# Patient Record
Sex: Male | Born: 1970 | Race: White | Hispanic: No | Marital: Married | Smoking: Current every day smoker
Health system: Southern US, Community
[De-identification: ages and names within clinical notes are randomized; demographics above are authoritative.]

## PROBLEM LIST (undated history)

## (undated) DIAGNOSIS — I10 Essential (primary) hypertension: Secondary | ICD-10-CM

## (undated) DIAGNOSIS — E119 Type 2 diabetes mellitus without complications: Secondary | ICD-10-CM

## (undated) HISTORY — PX: FOOT SURGERY: SHX648

---

## 2015-11-13 ENCOUNTER — Encounter (HOSPITAL_COMMUNITY): Payer: Self-pay | Admitting: Family Medicine

## 2015-11-13 ENCOUNTER — Inpatient Hospital Stay (HOSPITAL_COMMUNITY)
Admission: AD | Admit: 2015-11-13 | Discharge: 2015-11-16 | DRG: 064 | Disposition: A | Discharge status: Rehabilitation Facility | Payer: Self-pay | Source: Other Acute Inpatient Hospital | Attending: Neurology | Admitting: Neurology

## 2015-11-13 ENCOUNTER — Inpatient Hospital Stay (HOSPITAL_COMMUNITY): Payer: Self-pay

## 2015-11-13 DIAGNOSIS — E0811 Diabetes mellitus due to underlying condition with ketoacidosis with coma: Secondary | ICD-10-CM

## 2015-11-13 DIAGNOSIS — E785 Hyperlipidemia, unspecified: Secondary | ICD-10-CM | POA: Diagnosis present

## 2015-11-13 DIAGNOSIS — I161 Hypertensive emergency: Secondary | ICD-10-CM | POA: Diagnosis present

## 2015-11-13 DIAGNOSIS — I63219 Cerebral infarction due to unspecified occlusion or stenosis of unspecified vertebral arteries: Principal | ICD-10-CM | POA: Diagnosis present

## 2015-11-13 DIAGNOSIS — E1165 Type 2 diabetes mellitus with hyperglycemia: Secondary | ICD-10-CM | POA: Diagnosis present

## 2015-11-13 DIAGNOSIS — R1310 Dysphagia, unspecified: Secondary | ICD-10-CM | POA: Diagnosis present

## 2015-11-13 DIAGNOSIS — D72829 Elevated white blood cell count, unspecified: Secondary | ICD-10-CM | POA: Diagnosis present

## 2015-11-13 DIAGNOSIS — Z720 Tobacco use: Secondary | ICD-10-CM

## 2015-11-13 DIAGNOSIS — F17210 Nicotine dependence, cigarettes, uncomplicated: Secondary | ICD-10-CM | POA: Diagnosis present

## 2015-11-13 DIAGNOSIS — R270 Ataxia, unspecified: Secondary | ICD-10-CM | POA: Diagnosis present

## 2015-11-13 DIAGNOSIS — E119 Type 2 diabetes mellitus without complications: Secondary | ICD-10-CM

## 2015-11-13 DIAGNOSIS — I10 Essential (primary) hypertension: Secondary | ICD-10-CM

## 2015-11-13 DIAGNOSIS — G935 Compression of brain: Secondary | ICD-10-CM | POA: Diagnosis present

## 2015-11-13 DIAGNOSIS — G936 Cerebral edema: Secondary | ICD-10-CM | POA: Diagnosis present

## 2015-11-13 DIAGNOSIS — I639 Cerebral infarction, unspecified: Secondary | ICD-10-CM | POA: Diagnosis present

## 2015-11-13 HISTORY — DX: Type 2 diabetes mellitus without complications: E11.9

## 2015-11-13 HISTORY — DX: Essential (primary) hypertension: I10

## 2015-11-13 LAB — CBC
HEMATOCRIT: 47.2 % (ref 39.0–52.0)
HEMOGLOBIN: 16.6 g/dL (ref 13.0–17.0)
MCH: 30.6 pg (ref 26.0–34.0)
MCHC: 35.2 g/dL (ref 30.0–36.0)
MCV: 87.1 fL (ref 78.0–100.0)
Platelets: 285 10*3/uL (ref 150–400)
RBC: 5.42 MIL/uL (ref 4.22–5.81)
RDW: 12.4 % (ref 11.5–15.5)
WBC: 17.1 10*3/uL — ABNORMAL HIGH (ref 4.0–10.5)

## 2015-11-13 LAB — PROTIME-INR
INR: 1.1 (ref 0.00–1.49)
Prothrombin Time: 14.4 seconds (ref 11.6–15.2)

## 2015-11-13 LAB — APTT: aPTT: 28 seconds (ref 24–37)

## 2015-11-13 MED ORDER — SODIUM CHLORIDE 0.9 % IV SOLN
INTRAVENOUS | Status: AC
  Administered 2015-11-14: via INTRAVENOUS

## 2015-11-13 MED ORDER — ASPIRIN 300 MG RE SUPP
300.0000 mg | Freq: Every day | RECTAL | Status: DC
  Filled 2015-11-13: qty 1

## 2015-11-13 MED ORDER — ACETAMINOPHEN 325 MG PO TABS: 650.0000 mg | ORAL_TABLET | ORAL | Status: DC | PRN

## 2015-11-13 MED ORDER — ACETAMINOPHEN 650 MG RE SUPP: 650.0000 mg | RECTAL | Status: DC | PRN

## 2015-11-13 MED ORDER — HYDRALAZINE HCL 20 MG/ML IJ SOLN
10.0000 mg | Freq: Four times a day (QID) | INTRAMUSCULAR | Status: DC | PRN
  Administered 2015-11-14: 10 mg via INTRAVENOUS
  Filled 2015-11-13: qty 1

## 2015-11-13 MED ORDER — NICARDIPINE HCL IN NACL 20-0.86 MG/200ML-% IV SOLN
3.0000 mg/h | INTRAVENOUS | Status: DC
  Administered 2015-11-14: 5 mg/h via INTRAVENOUS
  Administered 2015-11-14: 8 mg/h via INTRAVENOUS
  Filled 2015-11-13 (×2): qty 200

## 2015-11-13 MED ORDER — SENNOSIDES-DOCUSATE SODIUM 8.6-50 MG PO TABS: 1.0000 | ORAL_TABLET | Freq: Every evening | ORAL | Status: DC | PRN

## 2015-11-13 MED ORDER — INSULIN ASPART 100 UNIT/ML ~~LOC~~ SOLN
0.0000 [IU] | SUBCUTANEOUS | Status: DC
  Administered 2015-11-14: 5 [IU] via SUBCUTANEOUS
  Administered 2015-11-14: 3 [IU] via SUBCUTANEOUS
  Administered 2015-11-14: 8 [IU] via SUBCUTANEOUS
  Administered 2015-11-14: 3 [IU] via SUBCUTANEOUS
  Administered 2015-11-14 (×2): 8 [IU] via SUBCUTANEOUS
  Administered 2015-11-14 – 2015-11-15 (×2): 5 [IU] via SUBCUTANEOUS
  Administered 2015-11-15: 3 [IU] via SUBCUTANEOUS
  Administered 2015-11-15: 11 [IU] via SUBCUTANEOUS
  Administered 2015-11-15 (×2): 8 [IU] via SUBCUTANEOUS
  Administered 2015-11-16 (×3): 5 [IU] via SUBCUTANEOUS
  Administered 2015-11-16: 8 [IU] via SUBCUTANEOUS
  Administered 2015-11-16: 5 [IU] via SUBCUTANEOUS

## 2015-11-13 MED ORDER — STROKE: EARLY STAGES OF RECOVERY BOOK
Freq: Once | Status: AC
  Administered 2015-11-13: 22:00:00
  Filled 2015-11-13: qty 1

## 2015-11-13 MED ORDER — ASPIRIN 325 MG PO TABS
325.0000 mg | ORAL_TABLET | Freq: Every day | ORAL | Status: DC
  Administered 2015-11-14 – 2015-11-16 (×3): 325 mg via ORAL
  Filled 2015-11-13 (×3): qty 1

## 2015-11-13 NOTE — Progress Notes (Signed)
Received call for transfer from EDP at [facility]. Patient with a history of diabetes, diet controlled and hypertension without antihypertensive medications. Patient presented to [facility] with 2 days of acute onset vertical vertigo and left hand clumsiness. Associated with nausea and general ataxia. Upon evaluation in the ED patient was found to have a large left cerebellar stroke. EDP discussed case with neurology and neurosurgery of [place] system recommended CTA to evaluate for possible dissection. Once patient has had CTA he'll be transferred to [facility] either as a stepdown admission or telemetry based on results of CTA. The patient arrives neurology and possibly neurosurgery will need to be consult. Per EDP there is no neurology service at [facility]  Shelly Flattenavid Merrell, MD [facility] 11/13/2015, 11:30 AM

## 2015-11-13 NOTE — H&P (Addendum)
[facility] History and Physical  Derrick Robinson Derrick Robinson:[id number] DOB: 05-15-1971 DOA: 11/13/2015  Referring physician: [facility] PCP: No PCP Per Patient   Chief Complaint: "He couldn't walk down the hall." - girlfriend  HPI: Derrick Robinson Derrick Robinson is a 45 y.o. male with a past medical history of diabetes and hypertension without any medication taken or medical follow-up presented originally to [facility] room with a complaint of dizziness and headache. The evening of Thanksgiving patient began to have acute onset of the world was spinning and left hand clumsiness. He also has some nausea and vomited one time nonbloody nonbilious. Patient had trouble walking. This went on for the next 2 days. When patient's symptoms is not resolved he went to [facility] room for evaluation. In the [facility] a CT was done and showed a large left cerebellar stroke. [facility] neurology was called and the ED physician was advised to give CTA before shipping the patient to [facility]. CTA was done which also showed a large left cerebellar infarct. Involving the PICA on the left. These images cannot be viewed by Derrick Robinson to fully assess any obstruction. Upon arriving to [facility] Dr. Cena Robinson with neurology was notified and patient was urgently taken to CT for repeat scan. Repeat scan shows obstruction and Dr. Cena Robinson wants patient transferred to ICU from the stepdown unit. The thinking is patient will have emergent surgery tonight.   CODE STATUS full  Review of Systems:  All pertinent positives are in history of present illness. All other systems were reviewed and are negative.  Past Medical History  Diagnosis Date  . Diabetes (HCC)   . HTN (hypertension)    Past Surgical History  Procedure Laterality Date  . Foot surgery     Social History:  reports that he has been smoking.  He does not have any smokeless tobacco history on file. He reports that he drinks alcohol. His drug history is not on  file.  No Known Allergies  History reviewed. No pertinent family history.   Prior to Admission medications   Not on File   Physical Exam: Filed Vitals:   11/13/15 2034 11/13/15 2245 11/13/15 2357  BP: 169/79 192/107   Pulse: 75 62   Temp: 98.1 F (36.7 C)    TempSrc: Oral    Resp: 16 10   Height:   5\' 11"  (1.803 m)  Weight:   79.7 kg (175 lb 11.3 oz)  SpO2: 100% 98%     Wt Readings from Last 3 Encounters:  11/13/15 79.7 kg (175 lb 11.3 oz)    General:  Appears calm and In mild pain distress Eyes: PERRL, EOMI, normal lids, iris ENT: grossly normal hearing, lips & tongue Neck:  no LAD, masses or thyromegaly Cardiovascular:  RRR, no m/r/g. No LE edema.  Respiratory:  CTA bilaterally, no w/r/r. Normal respiratory effort. Abdomen:  soft, ntnd Skin:  no rash or induration seen on limited exam Musculoskeletal:  grossly normal tone BUE/BLE Psychiatric:  grossly normal mood and affect, speech fluent and appropriate Neurologic:  CN 2-12 grossly intact, moves all extremities in coordinated fashion.Excellent recall and delayed short-term recall are good. Normal graphesthesia. Normal heel-to-shin. Finger-nose-finger on the left with slight deviation. Good truncal stability when sitting. Good concentration. Left going nystagmus. Lack of smooth pursuit tracking.           Labs on Admission:  Basic Metabolic Panel:  Recent Labs Lab 11/13/15 2332  NA 135  K 4.2  CL 100*  CO2 24  GLUCOSE 265*  BUN 15  CREATININE 0.80  CALCIUM 9.2   Liver Function Tests:  Recent Labs Lab 11/13/15 2332  AST 18  ALT 26  ALKPHOS 108  BILITOT 1.1  PROT 7.0  ALBUMIN 3.4*   No results for input(s): LIPASE, AMYLASE in the last 168 hours. No results for input(s): AMMONIA in the last 168 hours. CBC:  Recent Labs Lab 11/13/15 2332  WBC 17.1*  HGB 16.6  HCT 47.2  MCV 87.1  PLT 285   Cardiac Enzymes: No results for input(s): CKTOTAL, CKMB, CKMBINDEX, TROPONINI in the last 168  hours.  BNP (last 3 results) No results for input(s): BNP in the last 8760 hours.  ProBNP (last 3 results) No results for input(s): PROBNP in the last 8760 hours.   CREATININE: 0.8 (11/13/15 2332) Estimated creatinine clearance - 124.2 mL/min  CBG: No results for input(s): GLUCAP in the last 168 hours.  Radiological Exams on Admission: Ct Head Wo Contrast  11/13/2015  CLINICAL DATA:  Cerebellar stroke. Vertigo. Left hand clumsiness for 2 days. Patient transferred from [facility] earlier today. EXAM: CT HEAD WITHOUT CONTRAST TECHNIQUE: Contiguous axial images were obtained from the base of the skull through the vertex without intravenous contrast. COMPARISON:  Head CT earlier this day at 943 hour at [facility] FINDINGS: Decreased attenuation in the left cerebellum causing effacement of the fourth ventricle and mass effect on the midbrain, unchanged in degree and appearance from prior exam. No hemorrhagic transformation. There is tonsillar crowding unchanged. Overall size and extent is unchanged. No developing hydrocephalus. No evidence of supratentorial infarct or acute ischemia. Paranasal sinus disease with complete opacification of the frontal sinuses, left maxillary sinus, and majority of the ethmoid air cells. Mastoid air cells are well aerated. IMPRESSION: Unchanged appearance of the large left cerebellar infarct from exam 12 hours prior causing effacement of the fourth ventricle and mass effect on the midbrain. No hemorrhagic transformation. Electronically Signed   By: Rubye Oaks M.D.   On: 11/13/2015 23:04    EKG: Independently reviewed. From [facility]. Ventricular rate 75 PR interval 140 QRS 86 QTC 418 normal sinus rhythm no ST elevations or depressions. Normal EKG  Assessment/Plan Principal Problem:   Cerebellar stroke (HCC) Active Problems:   Diabetes (HCC)   HTN (hypertension)   Tobacco abuse  Cerebellar stroke -Patient currently stable  hemodynamically but in guarded condition Pt's care taken over by pulm/crit Neuro consulted pulm/crit and plans to consult IR CT brain shows mass effect on midbrain Neurology has been consulted Neurosurgery consults paged out, patient will not need to go to the OR immediately, plan is to monitor him Other imaging needed -`MRI brain, Vas US Carotid, ECHO Therapy - SLP, PT, OT  HTN - BP goals per pulm/crit & neuro Will need OP follow-up on d/c  DM - a1c pending, unk control - SSI ordered  Tobacco abuse - pt does not want a nicotic epatch  Elevated WBC - likely leukamoid rxn from stroke Will need CXR and blood cultures Mgmt to CC  Code Status: full  DVT Prophylaxis: scd Family Communication: [phone] [place] and surrogate  Disposition Plan: Pending Improvement    Haydee Salter, MD Family Medicine [facility] [email or web address] Password [email or web address]

## 2015-11-13 NOTE — Consult Note (Addendum)
Neurology Consultation Reason for Consult: Large cerebellar stroke  Referring Physician: Dr Melynda RippleHobbs  CC: Stroke, vomiting, HA  History is obtained from:  HPI: Derrick FilbertBradley Robinson is a 45 y.o. male with history of HTN and DMII.  He was at his mother's house on Saturday and had dinner there.  In the middle of the night he started having severe vomiting and did this x 2.  From the first time he vomited he became very wobbly and had vertigo.  He decided not to go to the hospital.  They thought maybe this was a stomach virus. He was not getting better by today and they decided to finally go to [place] [facility] where a CT of the head showed a large left sided cerebellar stroke with some mass effect.  He was transferred to us around 10:30pm.  The imaging was not available to me and after discussing the case with hospitalist who admitted him I ordered a STAT head CT in order to visualize the stroke better.  This was read as a stable scan compared to prior.  I spoke with Dr. Jeral FruitBotero as well as soon as I saw the scan and asked for the patient to be trasnferred to the ICU for frequent neuro checks and moderate decrease of blood pressure given the dissection.  I discussed the case with pulmonary critical care attending and he will be assuming as attending on the case.  I deferred the use of heparin given the large size of his stroke, as a bleed into it could possibly turn into sudden death.  He received a full dose of asa earlier today.  Will order daily asa 325.  Stroke attending to decide tomorrow on the use of heparin drip and possibly other imaging studies (MRA with fat suppresion or catheter angio if needed).  I discussed this with the patient and his wife at bedside.  One complicating factor is that he received morphine valium and dilaudid for headache earlier.  Both pt and wife denied any hx of head trauma.    LKW: Saturday night tpa given?: no  ROS: A 14 point ROS was performed and is negative except as noted in  the HPI.   No past medical history on file. - as HPI  No family history on file.  Social History:  has no tobacco, alcohol, and drug history on file.   Exam: Current vital signs: BP 192/107 mmHg  Pulse 62  Temp(Src) 98.1 F (36.7 C) (Oral)  Resp 10  SpO2 98% Vital signs in last 24 hours: Temp:  [98.1 F (36.7 C)] 98.1 F (36.7 C) (04/18 2034) Pulse Rate:  [62-75] 62 (04/18 2245) Resp:  [10-16] 10 (04/18 2245) BP: (169-192)/(79-107) 192/107 mmHg (04/18 2245) SpO2:  [98 %-100 %] 98 % (04/18 2245)   Physical Exam  Constitutional: Appears well-developed and well-nourished.  Psych: Affect appropriate to situation Eyes: No scleral injection HENT: No OP obstrucion Head: Normocephalic.  Cardiovascular: Normal rate and regular rhythm.  Respiratory: Effort normal and breath sounds normal to anterior ascultation GI: Soft.  No distension. There is no tenderness.  Skin: WDI  Neuro: Mental Status: Patient somnolent but arousable and follows commands. Patient is not really able to give a clear and coherent history because he is too somnolent No signs of aphasia or neglect Cranial Nerves: II: Visual Fields are full. Pupils are equal, round, and reactive to light. There is nystagmus on lateral gaze to both sides worse on left gaze. III,IV, VI: EOMI without ptosis or diploplia.  V: Facial sensation is symmetric to temperature VII: Facial movement is slightly asymmetric with minimal assymetry on the right.  VIII: hearing is intact to voice X: Uvula elevates symmetrically XI: Shoulder shrug is symmetric. XII: tongue is midline without atrophy or fasciculations.  Motor: Tone is normal. Bulk is normal. 5/5 strength was present in all four extremities Sensory: Sensation is symmetric to light touch and temperature in the arms and legs Deep Tendon Reflexes: 2+ and symmetric in the biceps and patellae Plantars: Toes are downgoing bilaterally Cerebellar: Some bilateral ataxia but  unclear if 2/2 visual issues in setting of valium dilaudid  I have reviewed labs in epic and the results pertinent to this consultation - has an elevated white cell count  I have reviewed the images obtained CT head - reviewed CTA results as angio was not itself available  Impression: large left cerebellar stroke with mass effect on 4th ventricle, in setting of new dissection of the left vertebral artery.  Case has been d/w ICU attending, IM attending and neurosurgery as well as with pt and wife.  Qq1h neuro checks x 12-24h (see further guidance by stroke attending later today).  Drop blood pressure to 140-150 systolic 2/2 vert dissection as this could extend with severe HTN.  Will need a full stroke w/u possibly including TEE - I will start with TTE.  Blood cultures given the elevated white count.  Please call me or dr Jeral Fruit from neurosurgery STAT if there is ANY change in neuro exam as this could be a sing of hydrocephalus and/or herniation and this could lead to sudden death.  Please refrain from using opioids and benzos as they will cloud the neuro exam and this is an essential part of his acute care in the next 24-48h  Recommendations: 1) as above

## 2015-11-14 ENCOUNTER — Inpatient Hospital Stay (HOSPITAL_COMMUNITY): Payer: Self-pay

## 2015-11-14 DIAGNOSIS — I6789 Other cerebrovascular disease: Secondary | ICD-10-CM

## 2015-11-14 DIAGNOSIS — I639 Cerebral infarction, unspecified: Secondary | ICD-10-CM

## 2015-11-14 DIAGNOSIS — I63219 Cerebral infarction due to unspecified occlusion or stenosis of unspecified vertebral arteries: Secondary | ICD-10-CM | POA: Insufficient documentation

## 2015-11-14 DIAGNOSIS — Z720 Tobacco use: Secondary | ICD-10-CM | POA: Diagnosis present

## 2015-11-14 DIAGNOSIS — E119 Type 2 diabetes mellitus without complications: Secondary | ICD-10-CM

## 2015-11-14 DIAGNOSIS — I10 Essential (primary) hypertension: Secondary | ICD-10-CM | POA: Diagnosis present

## 2015-11-14 DIAGNOSIS — I7774 Dissection of vertebral artery: Secondary | ICD-10-CM

## 2015-11-14 LAB — URINALYSIS, ROUTINE W REFLEX MICROSCOPIC
BILIRUBIN URINE: NEGATIVE
Glucose, UA: >1000 mg/dL — AB
HGB URINE DIPSTICK: NEGATIVE
Leukocytes, UA: NEGATIVE
Nitrite: NEGATIVE
Protein, ur: NEGATIVE mg/dL
SPECIFIC GRAVITY, URINE: 1.027 (ref 1.005–1.030)
pH: 5.5 (ref 5.0–8.0)

## 2015-11-14 LAB — COMPREHENSIVE METABOLIC PANEL
ALBUMIN: 3.4 g/dL — AB (ref 3.5–5.0)
ALK PHOS: 108 U/L (ref 38–126)
ALT: 26 U/L (ref 17–63)
ANION GAP: 11 (ref 5–15)
AST: 18 U/L (ref 15–41)
BILIRUBIN TOTAL: 1.1 mg/dL (ref 0.3–1.2)
BUN: 15 mg/dL (ref 6–20)
CALCIUM: 9.2 mg/dL (ref 8.9–10.3)
CO2: 24 mmol/L (ref 22–32)
Chloride: 100 mmol/L — ABNORMAL LOW (ref 101–111)
Creatinine, Ser: 0.8 mg/dL (ref 0.61–1.24)
GFR calc Af Amer: >60 mL/min (ref >60)
GLUCOSE: 265 mg/dL — AB (ref 65–99)
Potassium: 4.2 mmol/L (ref 3.5–5.1)
Sodium: 135 mmol/L (ref 135–145)
TOTAL PROTEIN: 7 g/dL (ref 6.5–8.1)

## 2015-11-14 LAB — ECHOCARDIOGRAM COMPLETE
Height: 71 in
WEIGHTICAEL: 2811.31 [oz_av]

## 2015-11-14 LAB — BASIC METABOLIC PANEL
Anion gap: 13 (ref 5–15)
BUN: 18 mg/dL (ref 6–20)
CALCIUM: 9.1 mg/dL (ref 8.9–10.3)
CHLORIDE: 99 mmol/L — AB (ref 101–111)
CO2: 24 mmol/L (ref 22–32)
CREATININE: 0.71 mg/dL (ref 0.61–1.24)
GFR calc non Af Amer: >60 mL/min (ref >60)
Glucose, Bld: 235 mg/dL — ABNORMAL HIGH (ref 65–99)
Potassium: 3.6 mmol/L (ref 3.5–5.1)
SODIUM: 136 mmol/L (ref 135–145)

## 2015-11-14 LAB — GLUCOSE, CAPILLARY
GLUCOSE-CAPILLARY: 186 mg/dL — AB (ref 65–99)
GLUCOSE-CAPILLARY: 255 mg/dL — AB (ref 65–99)
GLUCOSE-CAPILLARY: 259 mg/dL — AB (ref 65–99)
GLUCOSE-CAPILLARY: 284 mg/dL — AB (ref 65–99)
GLUCOSE-CAPILLARY: 86 mg/dL (ref 65–99)
Glucose-Capillary: 181 mg/dL — ABNORMAL HIGH (ref 65–99)
Glucose-Capillary: 218 mg/dL — ABNORMAL HIGH (ref 65–99)
Glucose-Capillary: 248 mg/dL — ABNORMAL HIGH (ref 65–99)

## 2015-11-14 LAB — URINE MICROSCOPIC-ADD ON: Bacteria, UA: NONE SEEN

## 2015-11-14 LAB — RAPID URINE DRUG SCREEN, HOSP PERFORMED
Amphetamines: POSITIVE — AB
Barbiturates: NOT DETECTED
Benzodiazepines: NOT DETECTED
Cocaine: NOT DETECTED
Opiates: POSITIVE — AB
Tetrahydrocannabinol: NOT DETECTED

## 2015-11-14 LAB — LIPID PANEL
CHOL/HDL RATIO: 3.8 ratio
Cholesterol: 229 mg/dL — ABNORMAL HIGH (ref 0–200)
HDL: 60 mg/dL (ref >40)
LDL Cholesterol: 141 mg/dL — ABNORMAL HIGH (ref 0–99)
Triglycerides: 140 mg/dL (ref <150)
VLDL: 28 mg/dL (ref 0–40)

## 2015-11-14 LAB — MRSA PCR SCREENING: MRSA BY PCR: NEGATIVE

## 2015-11-14 MED ORDER — SODIUM CHLORIDE 0.9 % IV SOLN
INTRAVENOUS | Status: DC
Start: 2015-11-14 — End: 2015-11-16

## 2015-11-14 NOTE — Progress Notes (Signed)
Pt transferred from 3s11 to 533m04 after arriving to [facility] around 2030.  Dr. Cena BentonVega and Dr. Melynda RippleHobbs were by to see pt before transfer.

## 2015-11-14 NOTE — Consult Note (Signed)
PULMONARY / CRITICAL CARE MEDICINE   Name: Derrick Robinson MRN: [id number] DOB: 1970-12-18    ADMISSION DATE:  11/13/2015 CONSULTATION DATE:  11/14/15  REFERRING MD:  Cena Benton, neuro  CHIEF COMPLAINT:  Loss of balance  HISTORY OF PRESENT ILLNESS:   Derrick Robinson is a 45 y.o. male with PMH of HTN and DM, not on any medications.  He was in his USOH up until Saturday evening 04/15 when he was at his mother;s house for dinner.  Following dinner, he had 2 episodes of vomiting followed by dizziness and vertigo.  He and his girlfriend initially thought symptoms were due to GI bug since this is what girlfriend had the week prior.  He continued to have vomiting and his balance got worse to the point that he could barely walk down the hallway. On 04/18, they decided to take him to [facility] where a CT of the head reportedly showed a large left sided cerebellar stroke with some mass effect (images and report not able to be viewed currently).  CTA was also obtained which reportedly revealed left verterbral artery dissection (again, images / report not able to be viewed). He was transferred to [facility] for further evaluation and management.  Upon arrival to [facility], he was seen by neurology who repeated CT scan which had similar findings. He was transferred to the ICU and PCCM was called and asked to take over his care.  Of note, he had BP as high as 205 at [place] per his girlfriend.  He is currently on nicardipine infusion and goal BP per neuro is 140 - 150. Neurosurgery has been consulted and did not feel that there was need for emergent surgical intervention.  PAST MEDICAL HISTORY :  He  has a past medical history of Diabetes (HCC) and HTN (hypertension).  PAST SURGICAL HISTORY: He  has past surgical history that includes Foot surgery.  No Known Allergies  No current facility-administered medications on file prior to encounter.   No current outpatient prescriptions on file prior to encounter.     FAMILY HISTORY:  His has no family status information on file.   SOCIAL HISTORY: He  reports that he has been smoking.  He does not have any smokeless tobacco history on file. He reports that he drinks alcohol.  REVIEW OF SYSTEMS:   All negative; except for those that are bolded, which indicate positives.  Constitutional: weight loss, weight gain, night sweats, fevers, chills, fatigue, weakness.  HEENT: headaches, sore throat, sneezing, nasal congestion, post nasal drip, difficulty swallowing, tooth/dental problems, visual complaints, visual changes, ear aches. Neuro: difficulty with speech, weakness, numbness, ataxia, loss of balance, dizziness. CV:  chest pain, orthopnea, PND, swelling in lower extremities, dizziness, palpitations, syncope.  Resp: cough, hemoptysis, dyspnea, wheezing. GI  heartburn, indigestion, abdominal pain, nausea, vomiting, diarrhea, constipation, change in bowel habits, loss of appetite, hematemesis, melena, hematochezia.  GU: dysuria, change in color of urine, urgency or frequency, flank pain, hematuria. MSK: joint pain or swelling, decreased range of motion. Psych: change in mood or affect, depression, anxiety, suicidal ideations, homicidal ideations. Skin: rash, itching, bruising.   SUBJECTIVE:  Denies any pain.  Able to move all extremities, talking normally.  Protecting airway.  VITAL SIGNS: BP 197/93 mmHg  Pulse 76  Temp(Src) 98.1 F (36.7 C) (Oral)  Resp 15  Ht  (1.803 m)  Wt 79.7 kg (175 lb 11.3 oz)  BMI 24.52 kg/m2  SpO2 97%  HEMODYNAMICS:    VENTILATOR SETTINGS:  INTAKE / OUTPUT:     PHYSICAL EXAMINATION: General: Young caucasian male, in NAD. Neuro: A&O x 3, non-focal.  HEENT: [place]/AT. PERRL, sclerae anicteric. Cardiovascular: RRR, no M/R/G.  Lungs: Respirations even and unlabored.  CTA bilaterally, No W/R/R. Abdomen: BS x 4, soft, NT/ND.  Musculoskeletal: No gross deformities, no edema.  Skin: Intact, warm, no  rashes.  LABS:  BMET  Recent Labs Lab 11/13/15 2332  NA 135  K 4.2  CL 100*  CO2 24  BUN 15  CREATININE 0.80  GLUCOSE 265*    Electrolytes  Recent Labs Lab 11/13/15 2332  CALCIUM 9.2    CBC  Recent Labs Lab 11/13/15 2332  WBC 17.1*  HGB 16.6  HCT 47.2  PLT 285    Coag's  Recent Labs Lab 11/13/15 2332  APTT 28  INR 1.10    Sepsis Markers No results for input(s): LATICACIDVEN, PROCALCITON, O2SATVEN in the last 168 hours.  ABG No results for input(s): PHART, PCO2ART, PO2ART in the last 168 hours.  Liver Enzymes  Recent Labs Lab 11/13/15 2332  AST 18  ALT 26  ALKPHOS 108  BILITOT 1.1  ALBUMIN 3.4*    Cardiac Enzymes No results for input(s): TROPONINI, PROBNP in the last 168 hours.  Glucose  Recent Labs Lab 11/14/15 0050  GLUCAP 218*    Imaging Ct Head Wo Contrast  11/13/2015  CLINICAL DATA:  Cerebellar stroke. Vertigo. Left hand clumsiness for 2 days. Patient transferred from [facility] earlier today. EXAM: CT HEAD WITHOUT CONTRAST TECHNIQUE: Contiguous axial images were obtained from the base of the skull through the vertex without intravenous contrast. COMPARISON:  Head CT earlier this day at 943 hour at [facility]. FINDINGS: Decreased attenuation in the left cerebellum causing effacement of the fourth ventricle and mass effect on the midbrain, unchanged in degree and appearance from prior exam. No hemorrhagic transformation. There is tonsillar crowding unchanged. Overall size and extent is unchanged. No developing hydrocephalus. No evidence of supratentorial infarct or acute ischemia. Paranasal sinus disease with complete opacification of the frontal sinuses, left maxillary sinus, and majority of the ethmoid air cells. Mastoid air cells are well aerated. IMPRESSION: Unchanged appearance of the large left cerebellar infarct from exam 12 hours prior causing effacement of the fourth ventricle and mass effect on the midbrain. No  hemorrhagic transformation. Electronically Signed   By: Rubye Oaks M.D.   On: 11/13/2015 23:04     STUDIES:  CT Head 04/18 > unchanged appearance of the large left cerebellar infarct causing effacement of the fourth ventricle and mass effect on the midbrain.  No hemorrhagic transformation. MRI brain 04/19 > Echo 04/19 >  CULTURES: Blood 04/19 >  ANTIBIOTICS: None.  SIGNIFICANT EVENTS: 04/19 > admitted with large left cerebellar infarct, left verterbral artery dissection.  LINES/TUBES: None.  DISCUSSION: 45 y.o. M with hx HTN and DM (not on meds), admitted 04/19 with large left cerebellar infarct and left verterbral artery dissection.  PCCM asked to assume care.  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 P:   Neurology following. Frequent neuro checks. Stroke workup per neuro. Neurosurgery consulted. Assess UDS.  CARDIOVASCULAR A:  Hypertensive emergency - per girlfriend, BP as high as 205 at [place]. Left verterbral artery dissection - per report, images / report from [place] not able to be viewed. P:  Continue nicardipine gtt for goal BP 140 - 150 per neuro. Hydralazine PRN. Neurosurgery  consulted, appreciate the assistance. Will need outpatient PCP follow up and long term antihypertensive regimen.  PULMONARY A: At risk for intubation. Tobacco use disorder. P:   Monitor closely. If mental status deteriorates at all, will require intubation. Tobacco cessation counseling.  RENAL A:   No acute issues. P:   NS @ 50. BMP in AM.  GASTROINTESTINAL A:   Nutrition. P:   NPO.  HEMATOLOGIC A:   VTE Prophylaxis. P:  SCD's. CBC in AM.  INFECTIOUS A:   Leukocytosis - no indication of or history to support infection, likely acute phase reactant. P:   Monitor clinically.  ENDOCRINE A:   Hx DM - not on meds. P:   SSI. Assess Hgb  A1c.   Family updated: Girlfriend at bedside.  Interdisciplinary Family Meeting v Palliative Care Meeting:  Due by: 11/21/15.  CC time: 35 minutes.  PCCM asked to assume care overnight.  If no acute events overnight, can transfer to neuro service in AM.   Rutherford Guysahul Tyshun Tuckerman, PA - [facility] Pager: [phone]  or [phone] 11/14/2015, 1:15 AM

## 2015-11-14 NOTE — Progress Notes (Signed)
Rehab Admissions Coordinator Note:  Patient was screened by Clois DupesBoyette, Franceska Strahm Godwin for appropriateness for an Inpatient Acute Rehab Consult per PT recommendation.   At this time, we are recommending an inpt rehab consult.  Clois DupesBoyette, Acxel Dingee Godwin 11/14/2015, 7:45 PM  I can be reached at [phone].

## 2015-11-14 NOTE — Evaluation (Signed)
Speech Language Pathology Evaluation Patient Details Name: Derrick FilbertBradley Stthomas MRN: [id number] DOB: [birth year] Today's Date: 11/14/2015 Time: [id number] SLP Time Calculation (min) (ACUTE ONLY): 15 min  Problem List:  Patient Active Problem List   Diagnosis Date Noted  . Diabetes (HCC) 11/14/2015  . HTN (hypertension) 11/14/2015  . Tobacco abuse 11/14/2015  . Vertebral artery dissection (HCC)   . Cerebellar stroke (HCC) 11/13/2015   Past Medical History:  Past Medical History  Diagnosis Date  . Diabetes (HCC)   . HTN (hypertension)    Past Surgical History:  Past Surgical History  Procedure Laterality Date  . Foot surgery     HPI:  45 y.o. male with h/o DM and HTN, who [facility] with c/o dizziness, headache, emesis and L side weakness for 2 days PTA. Pt believed s/s were due to GI bug as this is what girlfriend had week prior. MR Brain 4/19 evolving L posterior inferior cerebellar artery territory infarct with petechial hemorrhage. L vertebral artery occlusion. No prior h/o SLP intervention.   Assessment / Plan / Recommendation Clinical Impression  SLP administered Cognistat standardized assessment to evaluate pt's speech, language and cognition s/p CVA. Pt presents at baseline level of functioning and appears independent for tasks assessed. Pt, mother and daughter educated re: results of Cognistat as well as compensatory strategies to aid memory (which pt reported to be baseline). No further SLP intervention required.    SLP Assessment  Patient does not need any further Speech Lanaguage Pathology Services    Follow Up Recommendations  None          SLP Evaluation Prior Functioning  Cognitive/Linguistic Baseline: Within functional limits Type of Home: House  Lives With: Significant other Available Help at Discharge: Available PRN/intermittently;Family Vocation: Full time employment ([employer])   Cognition  Overall Cognitive Status: Within Functional  Limits for tasks assessed Arousal/Alertness: Awake/alert Orientation Level: Oriented X4 Attention: Sustained;Focused Focused Attention: Appears intact Sustained Attention: Appears intact Memory: Appears intact Awareness: Appears intact Problem Solving: Appears intact Safety/Judgment: Appears intact    Comprehension  Auditory Comprehension Overall Auditory Comprehension: Appears within functional limits for tasks assessed Yes/No Questions: Within Functional Limits Commands: Within Functional Limits Conversation: Simple Visual Recognition/Discrimination Discrimination: Not tested Reading Comprehension Reading Status: Not tested    Expression Expression Primary Mode of Expression: Verbal Verbal Expression Overall Verbal Expression: Appears within functional limits for tasks assessed Initiation: No impairment Level of Generative/Spontaneous Verbalization: Conversation Repetition: No impairment Naming: No impairment Pragmatics: No impairment Non-Verbal Means of Communication: Not applicable Written Expression Written Expression: Not tested   Oral / Motor  Oral Motor/Sensory Function Overall Oral Motor/Sensory Function: Within functional limits Motor Speech Overall Motor Speech: Appears within functional limits for tasks assessed Respiration: Within functional limits Phonation: Normal Resonance: Within functional limits Articulation: Within functional limitis Intelligibility: Intelligible Motor Planning: Witnin functional limits Motor Speech Errors: Not applicable   Lynita LombardLauren Michaelia Beilfuss, Student-SLP                   Melville Engen 11/14/2015, 2:28 PM

## 2015-11-14 NOTE — Progress Notes (Signed)
Echocardiogram 2D Echocardiogram has been performed.  Nolon RodBrown, Tony 11/14/2015, 2:59 PM

## 2015-11-14 NOTE — Progress Notes (Signed)
eLink Physician-Brief Progress Note Patient Name: Derrick FilbertBradley Begin DOB: Sep 02, 1970 MRN: [id number]   Date of Service  11/14/2015  HPI/Events of Note  45 yo white male with large cereballer CVA with increased somnelance,neurologost asked for PCCM admission for close montioring of airway and BP  eICU Interventions  Admit to PCCM, follow up neuro rec-BP control,neuro check,avoic secondary brain injury      Intervention Category Major Interventions: Intracranial hypertension - evaluation and management;Other: Evaluation Type: New Patient Evaluation  Derrick Robinson 11/14/2015, 12:12 AM

## 2015-11-14 NOTE — Evaluation (Signed)
Clinical/Bedside Swallow Evaluation Patient Details  Name: Derrick Robinson: [id number] Date of Birth: June 25, 1971  Today's Date: 11/14/2015 Time: SLP Start Time (ACUTE ONLY): 1143 SLP Stop Time (ACUTE ONLY): 1158 SLP Time Calculation (min) (ACUTE ONLY): 15 min  Past Medical History:  Past Medical History  Diagnosis Date  . Diabetes (HCC)   . HTN (hypertension)    Past Surgical History:  Past Surgical History  Procedure Laterality Date  . Foot surgery     HPI:  45 y.o. male with h/o DM and HTN, who [facility] ED with c/o dizziness, headache, emesis and L side weakness for 2 days PTA. Pt believed s/s were due to GI bug as this is what girlfriend had week prior. MR Brain 4/19 evolving L posterior inferior cerebellar artery territory infarct with petechial hemorrhage. L vertebral artery occlusion. No prior h/o SLP intervention.   Assessment / Plan / Recommendation Clinical Impression  Pt presented with swallow function WFL during bedside evaluation. SLP challenged with 3 oz. water test with no overt s/s of penetration/aspiration with seemingly quick and efficient swallow initiation. Pt, mother and daughter educated re: diet recommendation of regular textures and thin liquids (straws allowed), with meds whole with liquid. No further dysphagia intervention warranted at this time.    Aspiration Risk  Mild aspiration risk    Diet Recommendation Regular;Thin liquid   Liquid Administration via: Cup;Straw Medication Administration: Whole meds with liquid Supervision: Patient able to self feed Compensations: Minimize environmental distractions;Slow rate;Small sips/bites Postural Changes: Seated upright at 90 degrees    Other  Recommendations Oral Care Recommendations: Oral care BID   Follow up Recommendations  None    Frequency and Duration            Prognosis Prognosis for Safe Diet Advancement: Good      Swallow Study   General HPI: 45 y.o. male with h/o DM and  HTN, who [facility] with c/o dizziness, headache, emesis and L side weakness for 2 days PTA. Pt believed s/s were due to GI bug as this is what girlfriend had week prior. MR Brain 4/19 evolving L posterior inferior cerebellar artery territory infarct with petechial hemorrhage. L vertebral artery occlusion. No prior h/o SLP intervention. Type of Study: Bedside Swallow Evaluation Previous Swallow Assessment: none found Diet Prior to this Study: NPO Temperature Spikes Noted: No Respiratory Status: Room air History of Recent Intubation: No Behavior/Cognition: Alert;Cooperative;Pleasant mood Oral Cavity Assessment: Within Functional Limits Oral Care Completed by SLP: Yes Oral Cavity - Dentition: Adequate natural dentition Vision: Functional for self-feeding Self-Feeding Abilities: Able to feed self Patient Positioning: Upright in bed Baseline Vocal Quality: Normal Volitional Cough: Strong Volitional Swallow: Able to elicit    Oral/Motor/Sensory Function Overall Oral Motor/Sensory Function: Within functional limits   Ice Chips Ice chips: Not tested   Thin Liquid Thin Liquid: Within functional limits Presentation: Cup;Self Fed;Straw    Nectar Thick Nectar Thick Liquid: Not tested   Honey Thick Honey Thick Liquid: Not tested   Puree Puree: Not tested   Solid   GO   Solid: Within functional limits Presentation: Self Fed        Derrick Robinson 11/14/2015,2:28 PM

## 2015-11-14 NOTE — Progress Notes (Signed)
STROKE TEAM PROGRESS NOTE   HISTORY OF PRESENT ILLNESS Derrick Robinson is a 45 y.o. male with history of HTN and DMII. He was at his mother's house on Saturday and had dinner there. In the middle of the night he started having severe vomiting and did this x 2. From the first time he vomited he became very wobbly and had vertigo. He decided not to go to the hospital. They thought maybe this was a stomach virus. He was not getting better by today and they decided to finally go to [facility] where a CT of the head showed a large left sided cerebellar stroke with some mass effect. He was transferred to [facility] around 10:30pm on 11/13/2015. The imaging was not available and after discussing the case with hospitalist who admitted him a STAT head CT was ordered in order to visualize the stroke better. This was read as a stable scan compared to prior. Dr. Cena Benton spoke with Dr. Jeral Fruit as well and asked for the patient to be trasnferred to the ICU for frequent neuro checks and moderate decrease of blood pressure given the dissection. Dr. Cena Benton discussed the case with pulmonary critical care attending and he will be assuming as attending on the case. Deferred the use of heparin given the large size of his stroke, as a bleed into it could possibly turn into sudden death. He received a full dose of asa earlier today. Will order daily asa 325. Stroke attending to decide tomorrow on the use of heparin drip and possibly other imaging studies (MRA with fat suppresion or catheter angio if needed). Dr. Cena Benton discussed this with the patient and his wife at bedside. One complicating factor is that he received morphine valium and dilaudid for headache earlier. Both pt and wife denied any hx of head trauma. Patient was last known well Saturday 11/10/2015. Patient was not administered IV t-PA secondary to delay in arrival. He was admitted for further evaluation and treatment.   SUBJECTIVE (INTERVAL HISTORY) His family  is at the bedside.  He has remained stable overnight. He is awake. Have been asked by CCM to assume care.    OBJECTIVE Temp:  [97.4 F (36.3 C)-98.1 F (36.7 C)] 97.4 F (36.3 C) (04/19 0800) Pulse Rate:  [62-109] 69 (04/19 0900) Cardiac Rhythm:  [-] Normal sinus rhythm (04/19 0800) Resp:  [8-18] 8 (04/19 0900) BP: (112-198)/(51-107) 138/76 mmHg (04/19 0900) SpO2:  [97 %-100 %] 100 % (04/19 0900) Weight:  [79.7 kg (175 lb 11.3 oz)] 79.7 kg (175 lb 11.3 oz) (04/18 2357)  CBC:   Recent Labs Lab 11/13/15 2332  WBC 17.1*  HGB 16.6  HCT 47.2  MCV 87.1  PLT 285    Basic Metabolic Panel:   Recent Labs Lab 11/13/15 2332 11/14/15 0518  NA 135 136  K 4.2 3.6  CL 100* 99*  CO2 24 24  GLUCOSE 265* 235*  BUN 15 18  CREATININE 0.80 0.71  CALCIUM 9.2 9.1    Lipid Panel:     Component Value Date/Time   CHOL 229* 11/14/2015 0518   TRIG 140 11/14/2015 0518   HDL 60 11/14/2015 0518   CHOLHDL 3.8 11/14/2015 0518   VLDL 28 11/14/2015 0518   LDLCALC 141* 11/14/2015 0518   HgbA1c: No results found for: HGBA1C Urine Drug Screen:     Component Value Date/Time   LABOPIA POSITIVE* 11/14/2015 0125   COCAINSCRNUR NONE DETECTED 11/14/2015 0125   LABBENZ NONE DETECTED 11/14/2015 0125   AMPHETMU POSITIVE* 11/14/2015 0125  THCU NONE DETECTED 11/14/2015 0125   LABBARB NONE DETECTED 11/14/2015 0125      IMAGING  Ct Head Wo Contrast 11/13/2015  Unchanged appearance of the large left cerebellar infarct from exam 12 hours prior causing effacement of the fourth ventricle and mass effect on the midbrain. No hemorrhagic transformation.   Mr Brain Wo Contrast 11/14/2015   Motion degraded examination. 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 Mild ventriculomegaly, in part due to effacement of fourth ventricle. Mild cerebellar herniation and effaced pre pontine cistern. LEFT vertebral artery occlusion better seen on CTA from 1 day  prior reported separately. Mild to moderate white matter changes compatible with chronic small vessel ischemic disease.   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 Cannot exclude thrombus in the distal left vertebral artery. Left PICA is occluded compatible with acute infarct. Left vertebral occlusion could be due to atherosclerotic disease or dissection .    PHYSICAL EXAM Pleasant middle-age Caucasian obese male not in distress. . Afebrile. Head is nontraumatic. Neck is supple without bruit.    Cardiac exam no murmur or gallop. Lungs are clear to auscultation. Distal pulses are well felt. Neurological Exam :  Awake alert oriented 3 with normal speech and language function. No aphasia, dysarthria or apraxia.   Right pupil 4 mm and left 5 mm and both reactive. Fundi were not visualized. No Horner`s syndrome. Facial sensation is intact.Extraocular moments are full range with lateral gaze evoked nystagmus mostly on the left and minimum on right. Visual fields and acuity seems adequate. Face is symmetric without any weakness. Tongue is midline. Cough and gag seem adequate. Motor system exam revealed symmetric upper and lower extremity Strength No focal weakness.Finger-to-nose and knee to heel coordination seemed accurate though slightly slower on the left.  Deep tendon reflexes are 2+ symmetric. Plantars are downgoing. Sensation is intact and preserved bilaterally. Gait was not tested. ASSESSMENT/PLAN Mr. Derrick Robinson is a 45 y.o. male with history of diabetes, hypertension and cigarette smoking presenting with sudden onset headache and nausea followed by vomiting, Vertigo and left hand clumsiness. Transferred from [facility] to [facility].  He did not receive IV t-PA due to delay in arrival.   Stroke:  large left PICA infarct with petechial hemorrhagic transformation, cerebellar edema with cerebellar herniation and  effacement of fourth ventricle. Infarct secondary to L VA occlusion from large vessel disease source  [facility] no immediate surgery  Resultant  L sided ataxia, dysphagia  CT large left cerebellar infarct with effacement of fourth ventricle and mass effect on the midbrain  MRI  Evolving left pica territory infarct with petechial hemorrhage, effacement of fourth ventricle, mild cerebellar herniation and effaced prepontine cistern. Left vertebral artery occlusion. White matter changes. Small vessel disease.  CTA head and neck [facility])  Large acute L PICA infarct. L VA occluded at origin. L PICA occlusion.  Carotid Doppler  pending   2D Echo  pending   LDL 141  HgbA1c pending  SCDs for VTE prophylaxis Diet NPO time specified. ST to see today  No antithrombotic prior to admission, now on aspirin 325 mg daily/suppository. Once able to swallow, change to aspirin and plavix given large vessel stenosis  Patient counseled to be compliant with his antithrombotic medications   Consider for Stroke AF trial. [facility] research dept to follow up   Ongoing aggressive stroke risk factor management  Therapy recommendations:  Pending.  Ok to be OOB  Will keep in ICU today, anticipate transfer out tomorrow  Disposition:  pending   Essential Hypertension  No meds prior to admission, diet controlled  Stable at 150 this am  SBP goal 140-150 on cardene   Increase SBP goal to < 180  Hyperlipidemia  Home meds:  No statin  LDL 141, goal < 70  Add statin once able to swallow  Continue statin at discharge  Diabetes type 2  Not on home medications  HgbA1c pending, goal < 7.0  Other Stroke Risk Factors  Cigarette smoker, advised to stop smoking  ETOH use  Other Active Problems  Leukocytosis  Hospital day # 1  Rhoderick MoodyBIBY,Derrick  [facility] See Amion for Pager information 11/14/2015 9:40 AM  I have personally examined this patient, reviewed notes,  independently viewed imaging studies, participated in medical decision making and plan of care. I have made any additions or clarifications directly to the above note. Agree with note above.  He presented with headache and dizziness followed by severe vomiting and gait ataxia and vertigo due to the large left posterior inferior cerebellar artery territory infarct due to proximal left vertebral artery occlusion.  He remains at risk for neurological worsening, development of hydrocephalus, brain herniation and needs close neurological monitoring and blood pressure control. Check swallow eval by speech pathology and start aspirin and Plavix if he can swallow. I had a long discussion with the patient and mother and girl friend at the bedside and answered questions. This patient is critically ill and at significant risk of neurological worsening, death and care requires constant monitoring of vital signs, hemodynamics,respiratory and cardiac monitoring, extensive review of multiple databases, frequent neurological assessment, discussion with family, other specialists and medical decision making of high complexity.I have made any additions or clarifications directly to the above note.This critical care time does not reflect procedure time, or teaching time or supervisory time of PA/NP/Med Resident etc but could involve care discussion time.  I spent 50 minutes of neurocritical care time  in the care of  this patient. I have discussed with the patient and his girlfriend and mother about possible intestinal participated in the stroke atrial fibrillation trial.  He seems interested. We will give him information to review about the trial and decide. No study specific procedures were done prior to patient signing consent form.     Delia HeadyPramod Sethi, MD Medical Director [facility] Pager: [phone] 11/14/2015 2:08 PM    To contact Stroke Continuity provider, please refer to [email or web address]. After hours,  contact General Neurology

## 2015-11-14 NOTE — Progress Notes (Addendum)
VASCULAR LAB PRELIMINARY  PRELIMINARY  PRELIMINARY  PRELIMINARY  Carotid duplex completed.    Preliminary report:  Bilateral:  1-39% ICA stenosis.  Vertebral artery flow is antegrade on the right. Left not insonated.   Jann Milkovich, RVS 11/14/2015, 1:07 PM

## 2015-11-14 NOTE — Evaluation (Signed)
Physical Therapy Evaluation Patient Details Name: Derrick FilbertBradley Loberg MRN: [id number] DOB: April 01, 1971 Today's Date: 11/14/2015   History of Present Illness  45 y.o. male [facility] for dizzines, HA, and vomiting.  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  Pt with significant PMHx of HTN and DM.   Clinical Impression  Pt with significant balance and gait deficits as well as safety and awareness deficits making him a very high fall risk.  He would benefit from post acute rehab to maximize his rehab potential and is agreeable to seeing if he qualifies for CIR level therapies.   PT to follow acutely for deficits listed below.       Follow Up Recommendations CIR    Equipment Recommendations  Rolling walker with 5" wheels    Recommendations for Other Services Rehab consult     Precautions / Restrictions Precautions Precautions: Fall Precaution Comments: Pt with significant balance deficits      Mobility  Bed Mobility Overal bed mobility: Needs Assistance Bed Mobility: Supine to Sit;Sit to Supine     Supine to sit: Supervision Sit to supine: Supervision   General bed mobility comments: supervision for safety as pt moves quickly and has balance deficits and safety issues  Transfers Overall transfer level: Needs assistance Equipment used: 1 person hand held assist Transfers: Sit to/from Stand Sit to Stand: Min assist         General transfer comment: Min assist to stand EOB with legs resting against bed for stability. Pt initially thrusts forward and then over compensates and leans backwards in standing.  (+) sway in static standing.   Ambulation/Gait Ambulation/Gait assistance: +2 physical assistance;Mod assist Ambulation Distance (Feet): 180 Feet Assistive device: 2 person hand  held assist Gait Pattern/deviations: Ataxic;Staggering left;Staggering right     General Gait Details: Pt with staggering and ataxic gait pattern.  Anterior trunk, scissoring feet at times.  Verbal cues to slow down, get his feet under him.        Modified Rankin (Stroke Patients Only) Modified Rankin (Stroke Patients Only) Pre-Morbid Rankin Score: No symptoms Modified Rankin: Moderately severe disability     Balance Overall balance assessment: Needs assistance Sitting-balance support: Feet supported;No upper extremity supported Sitting balance-Leahy Scale: Fair     Standing balance support: Bilateral upper extremity supported Standing balance-Leahy Scale: Poor                               Pertinent Vitals/Pain Pain Assessment: No/denies pain    Home Living Family/patient expects to be discharged to:: Private residence Living Arrangements: Spouse/significant other (girlfriend) Available Help at Discharge: Available PRN/intermittently;Family (girlfriend works full time) Type of Home: House Home Access: Stairs to enter Entrance Stairs-Rails: None (girlfriend reports one can be put in if needed) Entrance Stairs-Number of Steps: 3 Home Layout: One level Home Equipment: Walker - 2 wheels;Bedside commode;Shower seat (family's equipment)      Prior Function Level of Independence: Independent         Comments: works as a Doctor, general practicecarpenter     Hand Dominance   Dominant Hand: Right    Extremity/Trunk Assessment   Upper Extremity Assessment: Defer to OT evaluation           Lower Extremity Assessment: RLE deficits/detail;LLE deficits/detail RLE Deficits / Details: strength 5/5/  throughout  LLE Deficits / Details: strength 5/5 throughout  Cervical / Trunk Assessment: Other exceptions  Communication   Communication: No difficulties  Cognition Arousal/Alertness: Awake/alert Behavior During Therapy: Impulsive Overall Cognitive Status: Impaired/Different  from baseline Area of Impairment: Safety/judgement;Awareness         Safety/Judgement: Decreased awareness of safety Awareness: Intellectual        General Comments General comments (skin integrity, edema, etc.): Pt with horizontal nystagmus with left gaze, reports some double vision at times.           Assessment/Plan    PT Assessment Patient needs continued PT services  PT Diagnosis Difficulty walking;Abnormality of gait;Altered mental status   PT Problem List Decreased activity tolerance;Decreased balance;Decreased coordination;Decreased mobility;Decreased knowledge of use of DME;Decreased safety awareness  PT Treatment Interventions DME instruction;Gait training;Stair training;Functional mobility training;Therapeutic activities;Therapeutic exercise;Balance training;Neuromuscular re-education;Patient/family education   PT Goals (Current goals can be found in the Care Plan section) Acute Rehab PT Goals Patient Stated Goal: to do what he needs to do to get better PT Goal Formulation: With patient/family Time For Goal Achievement: 11/28/15 Potential to Achieve Goals: Good    Frequency Min 4X/week    End of Session Equipment Utilized During Treatment: Gait belt Activity Tolerance: Patient tolerated treatment well Patient left: in bed;with call bell/phone within reach;with nursing/sitter in room;with family/visitor present Nurse Communication: Mobility status         Time: 4098-1191 PT Time Calculation (min) (ACUTE ONLY): 30 min   Charges:   PT Evaluation $PT Eval Moderate Complexity: 1 Procedure PT Treatments $Gait Training: 8-22 mins        Rozanne Heumann B. Ciarah Peace, PT, DPT [phone]   11/14/2015, 4:41 PM

## 2015-11-15 DIAGNOSIS — I639 Cerebral infarction, unspecified: Secondary | ICD-10-CM

## 2015-11-15 LAB — GLUCOSE, CAPILLARY
GLUCOSE-CAPILLARY: 260 mg/dL — AB (ref 65–99)
GLUCOSE-CAPILLARY: 327 mg/dL — AB (ref 65–99)
GLUCOSE-CAPILLARY: 249 mg/dL — AB (ref 65–99)
GLUCOSE-CAPILLARY: 292 mg/dL — AB (ref 65–99)
Glucose-Capillary: 169 mg/dL — ABNORMAL HIGH (ref 65–99)

## 2015-11-15 LAB — HEMOGLOBIN A1C
HEMOGLOBIN A1C: 12.6 % — AB (ref 4.8–5.6)
MEAN PLASMA GLUCOSE: 315 mg/dL

## 2015-11-15 LAB — CBC
HEMATOCRIT: 45.5 % (ref 39.0–52.0)
Hemoglobin: 16 g/dL (ref 13.0–17.0)
MCH: 30.2 pg (ref 26.0–34.0)
MCHC: 35.2 g/dL (ref 30.0–36.0)
MCV: 85.8 fL (ref 78.0–100.0)
Platelets: 300 10*3/uL (ref 150–400)
RBC: 5.3 MIL/uL (ref 4.22–5.81)
RDW: 12.1 % (ref 11.5–15.5)
WBC: 15.5 10*3/uL — ABNORMAL HIGH (ref 4.0–10.5)

## 2015-11-15 LAB — PHOSPHORUS: PHOSPHORUS: 2.6 mg/dL (ref 2.5–4.6)

## 2015-11-15 LAB — MAGNESIUM: MAGNESIUM: 2.4 mg/dL (ref 1.7–2.4)

## 2015-11-15 MED ORDER — LABETALOL HCL 5 MG/ML IV SOLN
10.0000 mg | INTRAVENOUS | Status: DC | PRN
  Administered 2015-11-15: 10 mg via INTRAVENOUS
  Filled 2015-11-15: qty 4

## 2015-11-15 MED ORDER — LIVING WELL WITH DIABETES BOOK
Freq: Once | Status: AC
  Administered 2015-11-15: 13:00:00
  Filled 2015-11-15: qty 1

## 2015-11-15 MED ORDER — LISINOPRIL 10 MG PO TABS
10.0000 mg | ORAL_TABLET | Freq: Every day | ORAL | Status: DC
  Administered 2015-11-15 – 2015-11-16 (×2): 10 mg via ORAL
  Filled 2015-11-15 (×2): qty 1

## 2015-11-15 MED ORDER — LISINOPRIL 10 MG PO TABS
10.0000 mg | ORAL_TABLET | Freq: Once | ORAL | Status: AC
  Administered 2015-11-15: 10 mg via ORAL
  Filled 2015-11-15: qty 1

## 2015-11-15 MED ORDER — METOPROLOL TARTRATE 25 MG PO TABS
25.0000 mg | ORAL_TABLET | Freq: Two times a day (BID) | ORAL | Status: DC
  Administered 2015-11-15 – 2015-11-16 (×3): 25 mg via ORAL
  Filled 2015-11-15 (×3): qty 1

## 2015-11-15 NOTE — Progress Notes (Signed)
OT Cancellation Note  Patient Details Name: Derrick Robinson MRN: [id number] DOB: [birth year]   Cancelled Treatment:    Reason Eval/Treat Not Completed: Pt with other provider.  Will reattempt.   Derrick Robinson, Derrick Waitman M  [name], OTR/L [phone]  11/15/2015, 1:00 PM

## 2015-11-15 NOTE — Progress Notes (Signed)
Physical Therapy Treatment Patient Details Name: Derrick Robinson MRN: [id number] DOB: 1970-12-23 Today's Date: 11/15/2015    History of Present Illness 45 y.o. male [facility] for dizzines, HA, and vomiting.  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  Pt with significant PMHx of HTN and DM.     PT Comments    Pt is progressing well with increased gait distance and stability with RW, however, still requires up to mod assist for balance during gait and with very poor safety awareness and poor awareness of deficits he is a very high fall risk.  I continue to recommend CIR level therapies at discharge.    Follow Up Recommendations  CIR     Equipment Recommendations  Rolling walker with 5" wheels    Recommendations for Other Services   NA     Precautions / Restrictions Precautions Precautions: Fall Precaution Comments: significant balance deficits and poor safety awareness and awareness of deficits.     Mobility  Bed Mobility Overal bed mobility: Needs Assistance Bed Mobility: Supine to Sit     Supine to sit: Supervision     General bed mobility comments: supervision for safety due to fast speed of movement.   Transfers Overall transfer level: Needs assistance Equipment used: Rolling walker (2 wheeled) Transfers: Sit to/from Stand Sit to Stand: Min assist         General transfer comment: Min assist for balance when going to stand.  Initially he still has pretty significant posterior almost sway back posture with weight on his heels.  RW helps bring him forward once forward momentum established during giat.   Ambulation/Gait Ambulation/Gait assistance: +2 safety/equipment;Mod assist Ambulation Distance (Feet): 200 Feet Assistive device: Rolling walker (2 wheeled) Gait  Pattern/deviations: Step-through pattern;Scissoring;Staggering left;Staggering right Gait velocity: too fast to be safe, verbal cues to slow down.    General Gait Details: Pt with staggering gait pattern, at times tipping RW up on one side, pt moving too quickly to be safe.  Mildly more stable with RW, but continued safety awareness and awareness of deficits issues keeping him from being safer with AD.  Mod assist during gait to maintain balance and prevent LOB throughout gait.        Modified Rankin (Stroke Patients Only) Pre-Morbid Rankin Score: No symptoms Modified Rankin: Moderately severe disability     Balance Overall balance assessment: Needs assistance Sitting-balance support: Feet supported;Bilateral upper extremity supported Sitting balance-Leahy Scale: Fair     Standing balance support: Bilateral upper extremity supported Standing balance-Leahy Scale: Poor                      Cognition Arousal/Alertness: Awake/alert Behavior During Therapy: Impulsive Overall Cognitive Status: Impaired/Different from baseline Area of Impairment: Safety/judgement;Awareness         Safety/Judgement: Decreased awareness of safety Awareness: Intellectual   General Comments: Pt when asked reported he could safely get up by himself (this is not the case, he is a very high fall risk and very unsteady on his feet even with AD).             Pertinent Vitals/Pain Pain Assessment: No/denies pain           PT Goals (current goals can now be found in the care plan section) Acute Rehab PT Goals Patient Stated Goal: to do  what he needs to do to get better Progress towards PT goals: Progressing toward goals    Frequency  Min 4X/week    PT Plan Current plan remains appropriate       End of Session Equipment Utilized During Treatment: Gait belt Activity Tolerance: Patient tolerated treatment well Patient left: in chair;with call bell/phone within reach;with chair alarm  set;with family/visitor present     Time: 0454-0981 PT Time Calculation (min) (ACUTE ONLY): 17 min  Charges:  $Gait Training: 8-22 mins                      Jamye Balicki B. Tattianna Schnarr, PT, DPT [phone]   11/15/2015, 4:13 PM

## 2015-11-15 NOTE — Progress Notes (Addendum)
Inpatient Diabetes Program Recommendations  AACE/ADA: New Consensus Statement on Inpatient Glycemic Control (2015)  Target Ranges:  Prepandial:   less than 140 mg/dL      Peak postprandial:   less than 180 mg/dL (1-2 hours)      Critically ill patients:  140 - 180 mg/dL   Review of Glycemic Control  Inpatient Diabetes Program Recommendations:  Insulin - Basal: add Lantus 15 units  Correction (SSI): change Novolog to TID + HS scale  HgbA1C: >12    Thank you  Piedad ClimesGina Duanne Duchesne BSN, RN,CDE Inpatient Diabetes Coordinator [phone] (team pager)

## 2015-11-15 NOTE — Progress Notes (Signed)
STROKE TEAM PROGRESS NOTE   HISTORY OF PRESENT ILLNESS Derrick Robinson is a 45 y.o. male with history of HTN and DMII. He was at his mother's house on Saturday and had dinner there. In the middle of the night he started having severe vomiting and did this x 2. From the first time he vomited he became very wobbly and had vertigo. He decided not to go to the hospital. They thought maybe this was a stomach virus. He was not getting better by today and they decided to finally go to [facility] where a CT of the head showed a large left sided cerebellar stroke with some mass effect. [facility]30[facility] on 11/13/2015. The imaging was not available and after discussing the case with hospitalist who admitted him a STAT head CT was ordered in order to visualize the stroke better. This was read as a stable scan compared to prior. Dr. Cena BentonVega spoke with Dr. Jeral FruitBotero as well and asked for the patient to be trasnferred to the ICU for frequent neuro checks and moderate decrease of blood pressure given the dissection. Dr. Cena BentonVega discussed the case with pulmonary critical care attending and he will be assuming as attending on the case. Deferred the use of heparin given the large size of his stroke, as a bleed into it could possibly turn into sudden death. He received a full dose of asa earlier today. Will order daily asa 325. Stroke attending to decide tomorrow on the use of heparin drip and possibly other imaging studies (MRA with fat suppresion or catheter angio if needed). Dr. Cena BentonVega discussed this with the patient and his wife at bedside. One complicating factor is that he received morphine valium and dilaudid for headache earlier. Both pt and wife denied any hx of head trauma. Patient was last known well Saturday 11/10/2015. Patient was not administered IV t-PA secondary to delay in arrival. He was admitted for further evaluation and treatment.   SUBJECTIVE (INTERVAL HISTORY) His mother  is at the bedside.  He has remained stable overnight. He is awake. His blood pressure is inadequately controlled. He denies any significant headache or nausea. He does have some truncal ataxia.Left-sided incoordination has improved   OBJECTIVE Temp:  [97.9 F (36.6 C)-98.8 F (37.1 C)] 98.8 F (37.1 C) (04/20 1402) Pulse Rate:  [58-114] 97 (04/20 1402) Cardiac Rhythm:  [-] Sinus bradycardia (04/20 0600) Resp:  [8-23] 20 (04/20 1402) BP: (92-200)/(59-138) 142/87 mmHg (04/20 1402) SpO2:  [97 %-100 %] 99 % (04/20 1402)  CBC:   Recent Labs Lab 11/13/15 2332 11/15/15 0307  WBC 17.1* 15.5*  HGB 16.6 16.0  HCT 47.2 45.5  MCV 87.1 85.8  PLT 285 300    Basic Metabolic Panel:   Recent Labs Lab 11/13/15 2332 11/14/15 0518 11/15/15 0307  NA 135 136  --   K 4.2 3.6  --   CL 100* 99*  --   CO2 24 24  --   GLUCOSE 265* 235*  --   BUN 15 18  --   CREATININE 0.80 0.71  --   CALCIUM 9.2 9.1  --   MG  --   --  2.4  PHOS  --   --  2.6    Lipid Panel:     Component Value Date/Time   CHOL 229* 11/14/2015 0518   TRIG 140 11/14/2015 0518   HDL 60 11/14/2015 0518   CHOLHDL 3.8 11/14/2015 0518   VLDL 28 11/14/2015 0518   LDLCALC  141* 11/14/2015 0518   HgbA1c:  Lab Results  Component Value Date   HGBA1C 12.6* 11/14/2015   Urine Drug Screen:     Component Value Date/Time   LABOPIA POSITIVE* 11/14/2015 0125   COCAINSCRNUR NONE DETECTED 11/14/2015 0125   LABBENZ NONE DETECTED 11/14/2015 0125   AMPHETMU POSITIVE* 11/14/2015 0125   THCU NONE DETECTED 11/14/2015 0125   LABBARB NONE DETECTED 11/14/2015 0125      IMAGING  Ct Head Wo Contrast 11/13/2015  Unchanged appearance of the large left cerebellar infarct from exam 12 hours prior causing effacement of the fourth ventricle and mass effect on the midbrain. No hemorrhagic transformation.   Mr Brain Wo Contrast 11/14/2015   Motion degraded examination. Evolving LEFT posterior inferior cerebellar artery territory infarct  with petechial hemorrhage. Mild ventriculomegaly, in part due to effacement of fourth ventricle. Mild cerebellar herniation and effaced pre pontine cistern. LEFT vertebral artery occlusion better seen on CTA from 1 day prior reported separately. Mild to moderate white matter changes compatible with chronic small vessel ischemic disease.   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 Cannot exclude thrombus in the distal left vertebral artery. Left PICA is occluded compatible with acute infarct. Left vertebral occlusion could be due to atherosclerotic disease or dissection .    PHYSICAL EXAM Pleasant middle-age Caucasian obese male not in distress. . Afebrile. Head is nontraumatic. Neck is supple without bruit.    Cardiac exam no murmur or gallop. Lungs are clear to auscultation. Distal pulses are well felt. Neurological Exam :  Awake alert oriented 3 with normal speech and language function. No aphasia, dysarthria or apraxia.   Right pupil 4 mm and left 5 mm and both reactive. Fundi were not visualized. No Horner`s syndrome. Facial sensation is intact.Extraocular moments are full range with lateral gaze evoked nystagmus mostly on the left and minimum on right. Visual fields and acuity seems adequate. Face is symmetric without any weakness. Tongue is midline. Cough and gag seem adequate. Motor system exam revealed symmetric upper and lower extremity Strength No focal weakness.Finger-to-nose and knee to heel coordination are accurate   Deep tendon reflexes are 2+ symmetric. Plantars are downgoing. Sensation is intact and preserved bilaterally. Gait was not tested. ASSESSMENT/PLAN Mr. Tilmon Wisehart is a 45 y.o. male with history of diabetes, hypertension and cigarette smoking presenting with sudden onset headache and nausea followed by vomiting, Vertigo and left hand clumsiness. [facility] to [facility].   He did not receive IV t-PA due to delay in arrival.    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 Infarct secondary to L VA occlusion from large vessel disease    Neurosurgery consulted [facility]) no immediate surgery  Resultant  L sided ataxia, dysphagia  CT large left cerebellar infarct with effacement of fourth ventricle and mass effect on the midbrain  MRI  Evolving left pica territory infarct with petechial hemorrhage, effacement of fourth ventricle, mild cerebellar herniation and effaced prepontine cistern. Left vertebral artery occlusion. White matter changes. Small vessel disease.  CTA head and neck [facility])  Large acute L PICA infarct. L VA occluded at origin. L PICA occlusion. Carotid Doppler Bilateral - 1% to 39% ICA stenosis. - Right - Vertebral artery flow is antegrade.  - Left vertebral appears to be occluded . 2D Echo  Left ventricle: The cavity size was normal. There  was mild  concentric hypertrophy. Systolic function was normal. The  estimated ejection fraction was in the range of 60% to 65%. Wall  motion was normal; there were no regional wall motion   abnormalities.  LDL 141  HgbA1c 12.6  SCDs for VTE prophylaxis Diet Carb Modified Fluid consistency:: Thin; Room service appropriate?: Yes. ST to see today  No antithrombotic prior to admission, now on aspirin 325 mg daily/suppository. Once able to swallow, change to aspirin and plavix given large vessel stenosis  Patient counseled to be compliant with his antithrombotic medications   Consider for Stroke AF trial. [facility] research dept to follow up   Ongoing aggressive stroke risk factor management  Therapy recommendations:  Pending. Ok to be OOB  Will keep in ICU today, anticipate transfer out tomorrow  Disposition:  pending   Essential Hypertension  No meds prior to admission, diet  controlled  Stable at 150 this am  SBP goal 140-150 on cardene   Increase SBP goal to < 180  Hyperlipidemia  Home meds:  No statin  LDL 141, goal < 70  Add statin once able to swallow  Continue statin at discharge  Diabetes type 2  Not on home medications  HgbA1c pending, goal < 7.0  Other Stroke Risk Factors  Cigarette smoker, advised to stop smoking  ETOH use  Other Active Problems  Leukocytosis  Hospital day # 2  Kaspar Albornoz  [facility] See Amion for Pager information 11/15/2015 3:45 PM  I have personally examined this patient, reviewed notes, independently viewed imaging studies, participated in medical decision making and plan of care. I have made any additions or clarifications directly to the above note. Agree with note above.  He presented with headache and dizziness followed by severe vomiting and gait ataxia and vertigo due to the large left posterior inferior cerebellar artery territory infarct due to proximal left vertebral artery occlusion.  He remains at risk for neurological worsening, development of hydrocephalus, brain herniation and needs close neurological monitoring and blood pressure control. Continue aspirin and Plavix  . I had a long discussion with the patient and mother  at the bedside and answered questions.Greater than 50% time during this  35 minute visit was spent on counseling and coordination of care note stroke risk and disposition.Plan to transfer patient to the neurology floor here at. Mobilize out of bed. Rehabilitation consult.   Delia Heady, MD Medical Director [facility] Pager: [phone] 11/15/2015 3:45 PM    To contact Stroke Continuity provider, please refer to [email or web address]. After hours, contact General Neurology

## 2015-11-15 NOTE — Progress Notes (Signed)
I met with Derrick Robinson and his Mom at bedside to discuss an inpt rehab admit when medically ready. They are in agreement to admit. I discussed with Dr. Leonie Man and he states Derrick Robinson not medically ready to admit today. I will follow up tomorrow. [phone]

## 2015-11-15 NOTE — Consult Note (Signed)
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 Pearlean BrownieSethi   HPI: Derrick Robinson is a 45 y.o. right handed male with history of tobacco abuse, hypertension, diabetes mellitus. Patient on no scheduled medications at time of admission. Lives with girlfriend ankle friend's mother. 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 Girlfriend works full time but girlfriends mother can assist. One level home with 3 steps to entry. Presented 11/13/2015 with persistent dizziness, bouts of nausea and vomiting. Initially [facility] where a CT of the head showed a large left-sided cerebellar stroke with some mass effect. [facility] was [facility]. 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 Mild ventriculomegaly with effacement of fourth ventricle. Left vertebral artery occlusion.. Carotid Dopplers 1-39 percent bilateral ICA stenosis. Echocardiogram with ejection fraction 65% no wall motion abnormalities. Neurology consulted. Patient did not receive TPA. Currently maintained on aspirin for CVA prophylaxis. Hemoglobin A1c of 12.6 with insulin therapy initiated. Tolerating a regular diet. Physical therapy evaluation completed 11/14/2015 with recommendations of physical medicine rehabilitation consult.   Review of Systems  Constitutional: Negative for fever and chills.  HENT: Negative for hearing loss.   Respiratory: Negative for cough and shortness of breath.   Cardiovascular: Negative for chest pain, palpitations and leg swelling.  Gastrointestinal: Positive for nausea, vomiting and constipation.  Genitourinary: Negative for dysuria and hematuria.  Skin: Negative for rash.  Neurological: Positive for dizziness, weakness and headaches. Negative for  seizures.  All other systems reviewed and are negative.  Past Medical History  Diagnosis Date  . Diabetes (HCC)   . HTN (hypertension)    Past Surgical History  Procedure Laterality Date  . Foot surgery     History reviewed. No pertinent family history. Social History:  reports that he has been smoking.  He does not have any smokeless tobacco history on file. He reports that he drinks alcohol. His drug history is not on file. Allergies: No Known Allergies Medications Prior to Admission  Medication Sig Dispense Refill  . Aspirin-Acetaminophen-Caffeine (GOODYS EXTRA STRENGTH PO) Take 1 packet by mouth every 6 (six) hours as needed. For pain      Home: Home Living Family/patient expects to be discharged to:: Private residence Living Arrangements: Spouse/significant other (girlfriend) Available Help at Discharge: Available PRN/intermittently, Family (girlfriend works full time) Type of Home: House Home Access: Stairs to enter Secretary/administratorntrance Stairs-Number of Steps: 3 Entrance Stairs-Rails: None (girlfriend reports one can be put in if needed) Home Layout: One level Bathroom Shower/Tub: Engineer, manufacturing systemsTub/shower unit Bathroom Toilet: Standard Home Equipment: Environmental consultantWalker - 2 wheels, Bedside commode, Shower seat (family's equipment)  Lives With: Significant other (girlfriend)  Functional History: Prior Function Level of Independence: Independent Comments: works as a Administratorcarpenter Functional Status:  Mobility: Bed Mobility Overal bed mobility: Needs Assistance Bed Mobility: Supine to Sit, Sit to Supine Supine to sit: Supervision Sit to supine: Supervision General bed mobility comments: supervision for safety as pt moves quickly and has balance deficits and safety issues Transfers Overall transfer level: Needs assistance Equipment used: 1 person hand held assist Transfers: Sit to/from Stand Sit to Stand: Min assist General transfer comment: Min assist to stand EOB with legs resting against bed for  stability. Pt initially thrusts forward and then over compensates and leans backwards in standing.  (+) sway in static standing.  Ambulation/Gait Ambulation/Gait assistance: +2  physical assistance, Mod assist Ambulation Distance (Feet): 180 Feet Assistive device: 2 person hand held assist Gait Pattern/deviations: Ataxic, Staggering left, Staggering right General Gait Details: Pt with staggering and ataxic gait pattern.  Anterior trunk, scissoring feet at times.  Verbal cues to slow down, get his feet under him.      ADL:    Cognition: Cognition Overall Cognitive Status: Impaired/Different from baseline Arousal/Alertness: Awake/alert Orientation Level: Oriented X4 Attention: Sustained, Focused Focused Attention: Appears intact Sustained Attention: Appears intact Memory: Appears intact Awareness: Appears intact Problem Solving: Appears intact Safety/Judgment: Appears intact Cognition Arousal/Alertness: Awake/alert Behavior During Therapy: Impulsive Overall Cognitive Status: Impaired/Different from baseline Area of Impairment: Safety/judgement, Awareness Safety/Judgement: Decreased awareness of safety Awareness: Intellectual  Blood pressure 148/83, pulse 58, temperature 98.4 F (36.9 C), temperature source Oral, resp. rate 11, height  (1.803 m), weight 79.7 kg (175 lb 11.3 oz), SpO2 100 %. Physical Exam  Vitals reviewed. Constitutional: He is oriented to person, place, and time. He appears well-developed.  HENT:  Head: Normocephalic.  Eyes: EOM are normal.  Neck: Normal range of motion. Neck supple. No thyromegaly present.  Cardiovascular: Normal rate and regular rhythm.   Respiratory: Effort normal and breath sounds normal. No respiratory distress.  GI: Soft. Bowel sounds are normal. He exhibits no distension.  Neurological: He is alert and oriented to person, place, and time.  Mood is flat but appropriate. He makes good eye contact with examiner. Patient with fair  awareness of deficits. Mild limb ataxia. No diplopia. Visual fields in tact. Difficulty staying awake. Fair insight and awareness. Strength grossly 4/5 prox to distal in all 4  Skin: Skin is warm and dry.    Results for orders placed or performed during the hospital encounter of 11/13/15 (from the past 24 hour(s))  Glucose, capillary     Status: Abnormal   Collection Time: 11/14/15  8:11 AM  Result Value Ref Range   Glucose-Capillary 186 (H) 65 - 99 mg/dL   Comment 1 Notify RN    Comment 2 Document in Chart   Glucose, capillary     Status: None   Collection Time: 11/14/15 10:59 AM  Result Value Ref Range   Glucose-Capillary 86 65 - 99 mg/dL   Comment 1 Notify RN    Comment 2 Document in Chart   Glucose, capillary     Status: Abnormal   Collection Time: 11/14/15 11:48 AM  Result Value Ref Range   Glucose-Capillary 181 (H) 65 - 99 mg/dL   Comment 1 Notify RN    Comment 2 Document in Chart   Glucose, capillary     Status: Abnormal   Collection Time: 11/14/15  4:02 PM  Result Value Ref Range   Glucose-Capillary 248 (H) 65 - 99 mg/dL   Comment 1 Notify RN    Comment 2 Document in Chart   Glucose, capillary     Status: Abnormal   Collection Time: 11/14/15  7:37 PM  Result Value Ref Range   Glucose-Capillary 284 (H) 65 - 99 mg/dL  Glucose, capillary     Status: Abnormal   Collection Time: 11/14/15 11:37 PM  Result Value Ref Range   Glucose-Capillary 259 (H) 65 - 99 mg/dL  CBC     Status: Abnormal   Collection Time: 11/15/15  3:07 AM  Result Value Ref Range   WBC 15.5 (H) 4.0 - 10.5 K/uL   RBC 5.30 4.22 - 5.81 MIL/uL   Hemoglobin 16.0 13.0 - 17.0 g/dL   HCT 62.1 30.8 -  52.0 %   MCV 85.8 78.0 - 100.0 fL   MCH 30.2 26.0 - 34.0 pg   MCHC 35.2 30.0 - 36.0 g/dL   RDW 16.1 09.6 - 04.5 %   Platelets 300 150 - 400 K/uL  Magnesium     Status: None   Collection Time: 11/15/15  3:07 AM  Result Value Ref Range   Magnesium 2.4 1.7 - 2.4 mg/dL  Phosphorus     Status: None   Collection  Time: 11/15/15  3:07 AM  Result Value Ref Range   Phosphorus 2.6 2.5 - 4.6 mg/dL  Glucose, capillary     Status: Abnormal   Collection Time: 11/15/15  3:24 AM  Result Value Ref Range   Glucose-Capillary 169 (H) 65 - 99 mg/dL   Ct Head Wo Contrast  11/13/2015  CLINICAL DATA:  Cerebellar stroke. Vertigo. Left hand clumsiness for 2 days. Patient transferred from [facility] earlier today. EXAM: CT HEAD WITHOUT CONTRAST TECHNIQUE: Contiguous axial images were obtained from the base of the skull through the vertex without intravenous contrast. COMPARISON:  Head CT earlier this day at 943 hour at [facility] FINDINGS: Decreased attenuation in the left cerebellum causing effacement of the fourth ventricle and mass effect on the midbrain, unchanged in degree and appearance from prior exam. No hemorrhagic transformation. There is tonsillar crowding unchanged. Overall size and extent is unchanged. No developing hydrocephalus. No evidence of supratentorial infarct or acute ischemia. Paranasal sinus disease with complete opacification of the frontal sinuses, left maxillary sinus, and majority of the ethmoid air cells. Mastoid air cells are well aerated. IMPRESSION: Unchanged appearance of the large left cerebellar infarct from exam 12 hours prior causing effacement of the fourth ventricle and mass effect on the midbrain. No hemorrhagic transformation. Electronically Signed   By: Rubye Oaks M.D.   On: 11/13/2015 23:04   Mr Brain Wo Contrast  11/14/2015  CLINICAL DATA:  Dizziness and headache, follow-up cerebellar stroke. 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 History of the hypertension and diabetes. EXAM: MRI HEAD WITHOUT CONTRAST TECHNIQUE: Multiplanar, multiecho pulse sequences of the brain and surrounding structures were obtained without intravenous contrast. COMPARISON:  CT head November 13, 2015 FINDINGS: Multiple sequences are moderately motion  degraded, axial T1 is severely motion degraded. INTRACRANIAL CONTENTS: Wedge-like area of reduced diffusion LEFT inferior cerebellar with corresponding low ADC values. T2 bright cytotoxic edema LEFT cerebellum and susceptibility artifact. Mild ventriculomegaly with effaced fourth ventricle. No convincing evidence of interstitial edema/ transependymal flow cerebral spinal fluid. Effaced pre pontine cistern with mild upward and downward herniation of the cerebellum. Patchy supratentorial white matter FLAIR T2 hyperintensities. No abnormal extra-axial fluid collections. No extra-axial masses though, contrast enhanced sequences would be more sensitive. Loss of LEFT vertebral artery flow void. ORBITS: The included ocular globes and orbital contents are non-suspicious. SINUSES: Moderate to severe paranasal sinusitis with LEFT maxillary sinus reduced diffusion concerning for fungal sinusitis. The mastoid air cells are well aerated. SKULL/SOFT TISSUES: Partially empty sella. No suspicious calvarial bone marrow signal. Craniocervical junction maintained. IMPRESSION: Motion degraded examination. 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 Mild ventriculomegaly, in part due to effacement of fourth ventricle. Mild cerebellar herniation and effaced pre pontine cistern. LEFT vertebral artery occlusion better seen on CTA from 1 day prior reported separately. Mild to moderate white matter changes compatible with chronic small vessel ischemic disease. Electronically Signed   By: Awilda Metro M.D.   On: 11/14/2015 04:59  Assessment/Plan: Diagnosis: left PICA infarct 1. Does the need for close, 24 hr/day medical supervision in concert with the patient's rehab needs make it unreasonable for this patient to be served in a less intensive setting? Yes 2. Co-Morbidities requiring supervision/potential complications: htn, dm 3. Due to bladder management, bowel management,  safety, skin/wound care, disease management, medication administration and pain management, does the patient require 24 hr/day rehab nursing? Yes 4. Does the patient require coordinated care of a physician, rehab nurse, PT (1-2 hrs/day, 5 days/week), OT (1-2 hrs/day, 5 days/week) and SLP (1-2 hrs/day, 5 days/week) to address physical and functional deficits in the context of the above medical diagnosis(es)? Yes Addressing deficits in the following areas: balance, endurance, locomotion, strength, transferring, bowel/bladder control, bathing, dressing, feeding, grooming, toileting, cognition and psychosocial support 5. Can the patient actively participate in an intensive therapy program of at least 3 hrs of therapy per day at least 5 days per week? Yes and Potentially 6. The potential for patient to make measurable gains while on inpatient rehab is excellent 7. Anticipated functional outcomes upon discharge from inpatient rehab are modified independent  with PT, modified independent and supervision with OT, modified independent with SLP. 8. Estimated rehab length of stay to reach the above functional goals is: 7-10 days 9. Does the patient have adequate social supports and living environment to accommodate these discharge functional goals? Yes 10. Anticipated D/C setting: Home 11. Anticipated post D/C treatments: HH therapy and Outpatient therapy 12. Overall Rehab/Functional Prognosis: excellent  RECOMMENDATIONS: This patient's condition is appropriate for continued rehabilitative care in the following setting: CIR Patient has agreed to participate in recommended program. Yes Note that insurance prior authorization may be required for reimbursement for recommended care.  Comment: Rehab Admissions Coordinator to follow up.  Thanks,  Ranelle Oyster, MD, [place] [facility]     11/15/2015

## 2015-11-15 NOTE — H&P (Signed)
Physical Medicine and Rehabilitation Admission H&P    Chief complaint: Headache  Derrick Robinson is a 45 y.o. right handed male with history of tobacco abuse, hypertension, diabetes mellitus. Patient on no scheduled medications at time of admission. Lives with girlfriend ankle friend's mother. 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 One level home with 3 steps to entry. Presented 11/13/2015 with persistent dizziness, bouts of nausea and vomiting. Initially presented to [facility] where a CT of the head showed a large left-sided cerebellar stroke with some mass effect. He was transferred to [facility] for further evaluation. 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 Mild ventriculomegaly with effacement of fourth ventricle. Left vertebral artery occlusion.. Intravenous Cardene initiated for blood pressure control. Carotid Dopplers 1-39 percent bilateral ICA stenosis. Echocardiogram with ejection fraction 65% no wall motion abnormalities. Neurology consulted. Patient did not receive TPA. Currently maintained on aspirin for CVA prophylaxis. Hemoglobin A1c of 12.6 with insulin therapy initiated. Tolerating a regular diet. Physical therapy evaluation completed 11/14/2015 with recommendations of physical medicine rehabilitation consult. Patient was admitted for a comprehensive rehabilitation program   ROS Constitutional: Negative for fever and chills.  HENT: Negative for hearing loss.  Respiratory: Negative for cough and shortness of breath.  Cardiovascular: Negative for chest pain, palpitations and leg swelling.  Gastrointestinal: Positive for nausea, vomiting and constipation.  Genitourinary: Negative for dysuria and hematuria.  Skin: Negative for rash.  Neurological: Positive for dizziness, weakness and headaches. Negative for  seizures.  All other systems reviewed and are negative   Past Medical History  Diagnosis Date  . Diabetes ([place])   . HTN (hypertension)    Past Surgical History  Procedure Laterality Date  . Foot surgery     History reviewed. No pertinent family history. Social History:  reports that he has been smoking.  He does not have any smokeless tobacco history on file. He reports that he drinks alcohol. His drug history is not on file. Allergies: No Known Allergies Medications Prior to Admission  Medication Sig Dispense Refill  . Aspirin-Acetaminophen-Caffeine (GOODYS EXTRA STRENGTH PO) Take 1 packet by mouth every 6 (six) hours as needed. For pain      Home: Home Living Family/patient expects to be discharged to:: Private residence Living Arrangements: Spouse/significant other (girlfriend) Available Help at Discharge: Available PRN/intermittently, Family (girlfriend works full time) Type of Home: House Home Access: Stairs to enter Technical brewer of Steps: 3 Entrance Stairs-Rails: None (girlfriend reports one can be put in if needed) Home Layout: One level Bathroom Shower/Tub: Chiropodist: [place]: Environmental consultant - 2 wheels, Bedside commode, Shower seat (family's equipment)  Lives With: Significant other (girlfriend)   Functional History: Prior Function Level of Independence: Independent Comments: works as a Counsellor Status:  Mobility: Bed Mobility Overal bed mobility: Needs Assistance Bed Mobility: Supine to Sit, Sit to Supine Supine to sit: Supervision Sit to supine: Supervision General bed mobility comments: supervision for safety as pt moves quickly and has balance deficits and safety issues Transfers Overall transfer level: Needs assistance Equipment used: 1 person hand held assist Transfers: Sit to/from Stand Sit to Stand: Min assist General transfer comment: Min assist to stand EOB with legs resting against bed for  stability. Pt initially thrusts forward and then over compensates and leans backwards in standing.  (+) sway in static standing.  Ambulation/Gait Ambulation/Gait assistance: +2  physical assistance, Mod assist Ambulation Distance (Feet): 180 Feet Assistive device: 2 person hand held assist Gait Pattern/deviations: Ataxic, Staggering left, Staggering right General Gait Details: Pt with staggering and ataxic gait pattern.  Anterior trunk, scissoring feet at times.  Verbal cues to slow down, get his feet under him.      ADL:    Cognition: Cognition Overall Cognitive Status: Impaired/Different from baseline Arousal/Alertness: Awake/alert Orientation Level: Oriented X4 Attention: Sustained, Focused Focused Attention: Appears intact Sustained Attention: Appears intact Memory: Appears intact Awareness: Appears intact Problem Solving: Appears intact Safety/Judgment: Appears intact Cognition Arousal/Alertness: Awake/alert Behavior During Therapy: Impulsive Overall Cognitive Status: Impaired/Different from baseline Area of Impairment: Safety/judgement, Awareness Safety/Judgement: Decreased awareness of safety Awareness: Intellectual  Physical Exam: Blood pressure 143/72, pulse 99, temperature 97.9 F (36.6 C), temperature source Oral, resp. rate 16, height 5' 11"  (1.803 m), weight 79.7 kg (175 lb 11.3 oz), SpO2 99 %. Physical Exam Constitutional: He is oriented to person, place, and time. He appears well-developed.  HENT:  Head: Normocephalic.  Eyes: EOM are normal.  Neck: Normal range of motion. Neck supple. No thyromegaly present.  Cardiovascular: Normal rate and regular rhythm. no murmur Respiratory: Effort normal and breath sounds normal. No respiratory distress. No wheezes or rales GI: Soft. Bowel sounds are normal. He exhibits no distension.  Neurological: He is alert and oriented to person, place, and time.  Mood is flat but appropriate. improved eye contact with examiner.  Patient with fair awareness of deficits. Mild limb ataxia, left more than right. No diplopia. Visual fields in tact. Stands and has difficulty maintaining position/posture, truncal ataxia noted. Fair insight and awareness. Strength grossly 4/5 prox to distal in all 4 limbs. No sensory deficits. DTR's 1+ Skin: Skin is warm and dry Psych: pleasant and cooperative   Results for orders placed or performed during the hospital encounter of 11/13/15 (from the past 48 hour(s))  CBC     Status: Abnormal   Collection Time: 11/13/15 11:32 PM  Result Value Ref Range   WBC 17.1 (H) 4.0 - 10.5 K/uL   RBC 5.42 4.22 - 5.81 MIL/uL   Hemoglobin 16.6 13.0 - 17.0 g/dL   HCT 47.2 39.0 - 52.0 %   MCV 87.1 78.0 - 100.0 fL   MCH 30.6 26.0 - 34.0 pg   MCHC 35.2 30.0 - 36.0 g/dL   RDW 12.4 11.5 - 15.5 %   Platelets 285 150 - 400 K/uL  Comprehensive metabolic panel     Status: Abnormal   Collection Time: 11/13/15 11:32 PM  Result Value Ref Range   Sodium 135 135 - 145 mmol/L   Potassium 4.2 3.5 - 5.1 mmol/L   Chloride 100 (L) 101 - 111 mmol/L   CO2 24 22 - 32 mmol/L   Glucose, Bld 265 (H) 65 - 99 mg/dL   BUN 15 6 - 20 mg/dL   Creatinine, Ser 0.80 0.61 - 1.24 mg/dL   Calcium 9.2 8.9 - 10.3 mg/dL   Total Protein 7.0 6.5 - 8.1 g/dL   Albumin 3.4 (L) 3.5 - 5.0 g/dL   AST 18 15 - 41 U/L   ALT 26 17 - 63 U/L   Alkaline Phosphatase 108 38 - 126 U/L   Total Bilirubin 1.1 0.3 - 1.2 mg/dL   GFR calc non Af Amer >60 >60 mL/min   GFR calc Af Amer >60 >60 mL/min    Comment: (NOTE) The eGFR has been calculated using the CKD EPI equation. This calculation has not been validated  in all clinical situations. eGFR's persistently <60 mL/min signify possible Chronic Kidney Disease.    Anion gap 11 5 - 15  Protime-INR     Status: None   Collection Time: 11/13/15 11:32 PM  Result Value Ref Range   Prothrombin Time 14.4 11.6 - 15.2 seconds   INR 1.10 0.00 - 1.49  APTT     Status: None   Collection Time: 11/13/15  11:32 PM  Result Value Ref Range   aPTT 28 24 - 37 seconds  MRSA PCR Screening     Status: None   Collection Time: 11/14/15 12:13 AM  Result Value Ref Range   MRSA by PCR NEGATIVE NEGATIVE    Comment:        The GeneXpert MRSA Assay (FDA approved for NASAL specimens only), is one component of a comprehensive MRSA colonization surveillance program. It is not intended to diagnose MRSA infection nor to guide or monitor treatment for MRSA infections.   Glucose, capillary     Status: Abnormal   Collection Time: 11/14/15 12:50 AM  Result Value Ref Range   Glucose-Capillary 218 (H) 65 - 99 mg/dL  Urine rapid drug screen (hosp performed)     Status: Abnormal   Collection Time: 11/14/15  1:25 AM  Result Value Ref Range   Opiates POSITIVE (A) NONE DETECTED   Cocaine NONE DETECTED NONE DETECTED   Benzodiazepines NONE DETECTED NONE DETECTED   Amphetamines POSITIVE (A) NONE DETECTED   Tetrahydrocannabinol NONE DETECTED NONE DETECTED   Barbiturates NONE DETECTED NONE DETECTED    Comment:        DRUG SCREEN FOR MEDICAL PURPOSES ONLY.  IF CONFIRMATION IS NEEDED FOR ANY PURPOSE, NOTIFY LAB WITHIN 5 DAYS.        LOWEST DETECTABLE LIMITS FOR URINE DRUG SCREEN Drug Class       Cutoff (ng/mL) Amphetamine      1000 Barbiturate      200 Benzodiazepine   174 Tricyclics       081 Opiates          300 Cocaine          300 THC              50   Urinalysis, Routine w reflex microscopic (not at [facility])     Status: Abnormal   Collection Time: 11/14/15  1:25 AM  Result Value Ref Range   Color, Urine STRAW (A) YELLOW   APPearance CLEAR CLEAR   Specific Gravity, Urine 1.027 1.005 - 1.030   pH 5.5 5.0 - 8.0   Glucose, UA >1000 (A) NEGATIVE mg/dL   Hgb urine dipstick NEGATIVE NEGATIVE   Bilirubin Urine NEGATIVE NEGATIVE   Ketones, ur >80 (A) NEGATIVE mg/dL   Protein, ur NEGATIVE NEGATIVE mg/dL   Nitrite NEGATIVE NEGATIVE   Leukocytes, UA NEGATIVE NEGATIVE  Urine microscopic-add on      Status: Abnormal   Collection Time: 11/14/15  1:25 AM  Result Value Ref Range   Squamous Epithelial / LPF 0-5 (A) NONE SEEN   WBC, UA 0-5 0 - 5 WBC/hpf   RBC / HPF 0-5 0 - 5 RBC/hpf   Bacteria, UA NONE SEEN NONE SEEN  Glucose, capillary     Status: Abnormal   Collection Time: 11/14/15  3:33 AM  Result Value Ref Range   Glucose-Capillary 255 (H) 65 - 99 mg/dL  Hemoglobin A1c     Status: Abnormal   Collection Time: 11/14/15  5:18 AM  Result Value Ref Range  Hgb A1c MFr Bld 12.6 (H) 4.8 - 5.6 %    Comment: (NOTE)         Pre-diabetes: 5.7 - 6.4         Diabetes: >6.4         Glycemic control for adults with diabetes: <7.0    Mean Plasma Glucose 315 mg/dL    Comment: (NOTE) Performed At: [facility] [place], [place] [id number] Lindon Romp MD [id number]   Lipid panel     Status: Abnormal   Collection Time: 11/14/15  5:18 AM  Result Value Ref Range   Cholesterol 229 (H) 0 - 200 mg/dL   Triglycerides 140 <150 mg/dL   HDL 60 >40 mg/dL   Total CHOL/HDL Ratio 3.8 RATIO   VLDL 28 0 - 40 mg/dL   LDL Cholesterol 141 (H) 0 - 99 mg/dL    Comment:        Total Cholesterol/HDL:CHD Risk Coronary Heart Disease Risk Table                     Men   Women  1/2 Average Risk   3.4   3.3  Average Risk       5.0   4.4  2 X Average Risk   9.6   7.1  3 X Average Risk  23.4   11.0        Use the calculated Patient Ratio above and the CHD Risk Table to determine the patient's CHD Risk.        ATP III CLASSIFICATION (LDL):  <100     mg/dL   Optimal  100-129  mg/dL   Near or Above                    Optimal  130-159  mg/dL   Borderline  160-189  mg/dL   High  >190     mg/dL   Very High   Basic metabolic panel     Status: Abnormal   Collection Time: 11/14/15  5:18 AM  Result Value Ref Range   Sodium 136 135 - 145 mmol/L   Potassium 3.6 3.5 - 5.1 mmol/L   Chloride 99 (L) 101 - 111 mmol/L   CO2 24 22 - 32 mmol/L   Glucose, Bld 235 (H) 65 - 99 mg/dL   BUN  18 6 - 20 mg/dL   Creatinine, Ser 0.71 0.61 - 1.24 mg/dL   Calcium 9.1 8.9 - 10.3 mg/dL   GFR calc non Af Amer >60 >60 mL/min   GFR calc Af Amer >60 >60 mL/min    Comment: (NOTE) The eGFR has been calculated using the CKD EPI equation. This calculation has not been validated in all clinical situations. eGFR's persistently <60 mL/min signify possible Chronic Kidney Disease.    Anion gap 13 5 - 15  Glucose, capillary     Status: Abnormal   Collection Time: 11/14/15  8:11 AM  Result Value Ref Range   Glucose-Capillary 186 (H) 65 - 99 mg/dL   Comment 1 Notify RN    Comment 2 Document in Chart   Glucose, capillary     Status: None   Collection Time: 11/14/15 10:59 AM  Result Value Ref Range   Glucose-Capillary 86 65 - 99 mg/dL   Comment 1 Notify RN    Comment 2 Document in Chart   Glucose, capillary     Status: Abnormal   Collection Time: 11/14/15 11:48 AM  Result Value  Ref Range   Glucose-Capillary 181 (H) 65 - 99 mg/dL   Comment 1 Notify RN    Comment 2 Document in Chart   Glucose, capillary     Status: Abnormal   Collection Time: 11/14/15  4:02 PM  Result Value Ref Range   Glucose-Capillary 248 (H) 65 - 99 mg/dL   Comment 1 Notify RN    Comment 2 Document in Chart   Glucose, capillary     Status: Abnormal   Collection Time: 11/14/15  7:37 PM  Result Value Ref Range   Glucose-Capillary 284 (H) 65 - 99 mg/dL  Glucose, capillary     Status: Abnormal   Collection Time: 11/14/15 11:37 PM  Result Value Ref Range   Glucose-Capillary 259 (H) 65 - 99 mg/dL  CBC     Status: Abnormal   Collection Time: 11/15/15  3:07 AM  Result Value Ref Range   WBC 15.5 (H) 4.0 - 10.5 K/uL   RBC 5.30 4.22 - 5.81 MIL/uL   Hemoglobin 16.0 13.0 - 17.0 g/dL   HCT 45.5 39.0 - 52.0 %   MCV 85.8 78.0 - 100.0 fL   MCH 30.2 26.0 - 34.0 pg   MCHC 35.2 30.0 - 36.0 g/dL   RDW 12.1 11.5 - 15.5 %   Platelets 300 150 - 400 K/uL  Magnesium     Status: None   Collection Time: 11/15/15  3:07 AM  Result  Value Ref Range   Magnesium 2.4 1.7 - 2.4 mg/dL  Phosphorus     Status: None   Collection Time: 11/15/15  3:07 AM  Result Value Ref Range   Phosphorus 2.6 2.5 - 4.6 mg/dL  Glucose, capillary     Status: Abnormal   Collection Time: 11/15/15  3:24 AM  Result Value Ref Range   Glucose-Capillary 169 (H) 65 - 99 mg/dL  Glucose, capillary     Status: Abnormal   Collection Time: 11/15/15  8:00 AM  Result Value Ref Range   Glucose-Capillary 260 (H) 65 - 99 mg/dL   Ct Head Wo Contrast  11/13/2015  CLINICAL DATA:  Cerebellar stroke. Vertigo. Left hand clumsiness for 2 days. Patient transferred from [facility] earlier today. EXAM: CT HEAD WITHOUT CONTRAST TECHNIQUE: Contiguous axial images were obtained from the base of the skull through the vertex without intravenous contrast. COMPARISON:  Head CT earlier this day at 943 hour at [facility]. FINDINGS: Decreased attenuation in the left cerebellum causing effacement of the fourth ventricle and mass effect on the midbrain, unchanged in degree and appearance from prior exam. No hemorrhagic transformation. There is tonsillar crowding unchanged. Overall size and extent is unchanged. No developing hydrocephalus. No evidence of supratentorial infarct or acute ischemia. Paranasal sinus disease with complete opacification of the frontal sinuses, left maxillary sinus, and majority of the ethmoid air cells. Mastoid air cells are well aerated. IMPRESSION: Unchanged appearance of the large left cerebellar infarct from exam 12 hours prior causing effacement of the fourth ventricle and mass effect on the midbrain. No hemorrhagic transformation. Electronically Signed   By: Jeb Levering M.D.   On: 11/13/2015 23:04   Mr Brain Wo Contrast  11/14/2015  CLINICAL DATA:  Dizziness and headache, follow-up cerebellar stroke. 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 History of the hypertension and diabetes. EXAM: MRI HEAD  WITHOUT CONTRAST TECHNIQUE: Multiplanar, multiecho pulse sequences of the brain and surrounding structures were obtained without intravenous contrast. COMPARISON:  CT head November 13, 2015 FINDINGS: Multiple sequences are  moderately motion degraded, axial T1 is severely motion degraded. INTRACRANIAL CONTENTS: Wedge-like area of reduced diffusion LEFT inferior cerebellar with corresponding low ADC values. T2 bright cytotoxic edema LEFT cerebellum and susceptibility artifact. Mild ventriculomegaly with effaced fourth ventricle. No convincing evidence of interstitial edema/ transependymal flow cerebral spinal fluid. Effaced pre pontine cistern with mild upward and downward herniation of the cerebellum. Patchy supratentorial white matter FLAIR T2 hyperintensities. No abnormal extra-axial fluid collections. No extra-axial masses though, contrast enhanced sequences would be more sensitive. Loss of LEFT vertebral artery flow void. ORBITS: The included ocular globes and orbital contents are non-suspicious. SINUSES: Moderate to severe paranasal sinusitis with LEFT maxillary sinus reduced diffusion concerning for fungal sinusitis. The mastoid air cells are well aerated. SKULL/SOFT TISSUES: Partially empty sella. No suspicious calvarial bone marrow signal. Craniocervical junction maintained. IMPRESSION: Motion degraded examination. 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 Mild ventriculomegaly, in part due to effacement of fourth ventricle. Mild cerebellar herniation and effaced pre pontine cistern. LEFT vertebral artery occlusion better seen on CTA from 1 day prior reported separately. Mild to moderate white matter changes compatible with chronic small vessel ischemic disease. Electronically Signed   By: Elon Alas M.D.   On: 11/14/2015 04:59       Medical Problem List and Plan: 1.  Dizziness, nausea vomiting with decreased functional mobility secondary to  left PICA infarct/petechial hemorrhagic transformation. 2.  DVT Prophylaxis/Anticoagulation: SCDs. Monitor for any signs of DVT  -mobilize 3. Pain Management: Tylenol as needed 4. Hypertension. Lisinopril 10 mg daily, Lopressor 25 mg twice a day. Permissive hypertension early on. 5. Neuropsych: This patient is capable of making decisions on his own behalf. 6. Skin/Wound Care: Routine skin checks 7. Fluids/Electrolytes/Nutrition: Routine I&O with follow-up chemistries  -encourage adequate PO 8. Uncontrolled diabetes mellitus. Hemoglobin A1c 12.6. Check blood sugars before meals and at bedtime. Diabetic teaching.Lantus 15 unit QHS 9. Tobacco abuse. Counseling    Post Admission Physician Evaluation: 1. Functional deficits secondary  to left PICA infarct with hermorrhage. 2. Patient is admitted to receive collaborative, interdisciplinary care between the physiatrist, rehab nursing staff, and therapy team. 3. Patient's level of medical complexity and substantial therapy needs in context of that medical necessity cannot be provided at a lesser intensity of care such as a SNF. 4. Patient has experienced substantial functional loss from his/her baseline which was documented above under the "Functional History" and "Functional Status" headings.  Judging by the patient's diagnosis, physical exam, and functional history, the patient has potential for functional progress which will result in measurable gains while on inpatient rehab.  These gains will be of substantial and practical use upon discharge  in facilitating mobility and self-care at the household level. 5. Physiatrist will provide 24 hour management of medical needs as well as oversight of the therapy plan/treatment and provide guidance as appropriate regarding the interaction of the two. 6. 24 hour rehab nursing will assist with bladder management, bowel management, safety, skin/wound care, disease management, medication administration, pain  management and patient education  and help integrate therapy concepts, techniques,education, etc. 7. PT will assess and treat for/with: Lower extremity strength, range of motion, stamina, balance, functional mobility, safety, adaptive techniques and equipment, NMR, vestibular assessment/rx, symptom mgt, ego support.   Goals are: mod I. 8. OT will assess and treat for/with: ADL's, functional mobility, safety, upper extremity strength, adaptive techniques and equipment, NMR, vestibular assessment/rx, symptom mgt, community reintegration.   Goals are: mod I. Therapy may proceed with  showering this patient. 9. SLP will assess and treat for/with: cognition,communication.  Goals are: mod I. 10. Case Management and Social Worker will assess and treat for psychological issues and discharge planning. 11. Team conference will be held weekly to assess progress toward goals and to determine barriers to discharge. 12. Patient will receive at least 3 hours of therapy per day at least 5 days per week. 13. ELOS: 7 days       14. Prognosis:  excellent     Meredith Staggers, MD, [place] [facility] 11/16/2015

## 2015-11-15 NOTE — Evaluation (Signed)
Occupational Therapy Evaluation Patient Details Name: Derrick Robinson MRN: [id number] DOB: [birth year] Today's Date: 11/15/2015    History of Present Illness 45 y.o. male admitted to [facility] on 11/13/15 for dizzines, HA, and vomiting.  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  Pt with significant PMHx of HTN and DM.    Clinical Impression   Pt admitted with above. He demonstrates the below listed deficits and will benefit from continued OT to maximize safety and independence with BADLs.  Pt presents to OT with visual deficits, impaired coordination and balance due to ataxia, as well as decreased safety awareness.  He currently requires mod A for LB ADLs due to impaired balance.  Recommend CIR.  Will follow.       Follow Up Recommendations  CIR;Supervision/Assistance - 24 hour    Equipment Recommendations  3 in 1 bedside comode;Tub/shower bench    Recommendations for Other Services Rehab consult     Precautions / Restrictions Precautions Precautions: Fall Precaution Comments: significant balance deficits and poor safety awareness and awareness of deficits.       Mobility Bed Mobility Overal bed mobility: Needs Assistance Bed Mobility: Sit to Supine     Supine to sit: Supervision Sit to supine: Supervision   General bed mobility comments: supervision for safety due to fast speed of movement.   Transfers Overall transfer level: Needs assistance Equipment used: 1 person hand held assist Transfers: Sit to/from [employer] Sit to Stand: Min assist Stand pivot transfers: Min assist       General transfer comment: Pt requires min A for balance.  Ataxia noted     Balance Overall balance assessment: Needs assistance Sitting-balance support: Feet supported Sitting balance-Leahy  Scale: Fair     Standing balance support: Single extremity supported;Bilateral upper extremity supported Standing balance-Leahy Scale: Poor Standing balance comment: requires min                             ADL Overall ADL's : Needs assistance/impaired Eating/Feeding: Modified independent   Grooming: Wash/dry hands;Wash/dry face;Oral care;Brushing hair;Set up;Supervision/safety;Sitting   Upper Body Bathing: Supervision/ safety;Set up;Sitting   Lower Body Bathing: Moderate assistance;Sit to/from stand   Upper Body Dressing : Set up;Supervision/safety;Sitting   Lower Body Dressing: Moderate assistance;Sit to/from stand Lower Body Dressing Details (indicate cue type and reason): Pt requires assist for balance in standing  Toilet Transfer: Minimal assistance;Moderate assistance;Ambulation;Comfort height Microbiologist Details (indicate cue type and reason): Pt with LOB requring mod A to recover  Toileting- Clothing Manipulation and Hygiene: Moderate assistance;Sit to/from stand       Functional mobility during ADLs: Minimal assistance;Moderate assistance General ADL Comments: pt requires assist due to impaired balance      Vision Vision Assessment?: Yes Eye Alignment: Impaired (comment) Ocular Range of Motion: Restricted on the left Tracking/Visual Pursuits: Right eye does not track laterally;Decreased smoothness of vertical tracking;Decreased smoothness of eye movement to RIGHT superior field;Decreased smoothness of eye movement to LEFT superior field;Decreased smoothness of eye movement to RIGHT inferior field;Decreased smoothness of eye movement to LEFT inferior field Visual Fields: No apparent deficits Diplopia Assessment: Disappears with one eye closed Additional Comments: Pt frequently closes one eye.  He reports objects bounce and endorses diplopia with Lt gaze.  He demonstrates horizontal and rotational nystagmus that is worst  with Lt gaze.   He is able to achieve ~75% abduction Lt eye, but unable to maintain gaze    Perception Perception Perception Tested?: Yes   Praxis Praxis Praxis tested?: Within functional limits    Pertinent Vitals/Pain Pain Assessment: No/denies pain     Hand Dominance Right   Extremity/Trunk Assessment Upper Extremity Assessment Upper Extremity Assessment: RUE deficits/detail;LUE deficits/detail RUE Deficits / Details: mild dysmetria  RUE Coordination: decreased fine motor;decreased gross motor LUE Deficits / Details: mild dysmetria  LUE Coordination: decreased fine motor;decreased gross motor   Lower Extremity Assessment Lower Extremity Assessment: Defer to PT evaluation   Cervical / Trunk Assessment Cervical / Trunk Exceptions: decreased trunk control    Communication Communication Communication: No difficulties   Cognition Arousal/Alertness: Awake/alert Behavior During Therapy: Impulsive Overall Cognitive Status: Impaired/Different from baseline Area of Impairment: Safety/judgement;Awareness         Safety/Judgement: Decreased awareness of safety Awareness: Intellectual   General Comments: Pt states he feels staff is "flinging me around" when asked if he is off balance.  He had decreased awareness that he is ataxic and has balance defitis    General Comments       Exercises       Shoulder Instructions      Home Living Family/patient expects to be discharged to:: Private residence Living Arrangements: Spouse/significant other (girlfriend) Available Help at Discharge: Available PRN/intermittently;Family (girlfriend works full time) Type of Home: House Home Access: Stairs to enter Secretary/administratorntrance Stairs-Number of Steps: 3 Entrance Stairs-Rails: None (girlfriend reports one can be put in if needed) Home Layout: One level     Bathroom Shower/Tub: Tub/shower unit Shower/tub characteristics: Curtain FirefighterBathroom Toilet: Standard     Home Equipment: Environmental consultantWalker - 2 wheels;Bedside  commode;Shower seat (family's equipment)          Prior Functioning/Environment Level of Independence: Independent        Comments: works as a Copywriter, advertisingcarpenter    OT Diagnosis: Generalized weakness;Disturbance of vision;Cognitive deficits;Ataxia   OT Problem List: Decreased strength;Decreased activity tolerance;Impaired balance (sitting and/or standing);Impaired vision/perception;Decreased coordination;Decreased cognition;Decreased safety awareness;Decreased knowledge of use of DME or AE;Impaired UE functional use   OT Treatment/Interventions: Self-care/ADL training;Neuromuscular education;DME and/or AE instruction;Therapeutic activities;Visual/perceptual remediation/compensation;Cognitive remediation/compensation;Patient/family education;Balance training    OT Goals(Current goals can be found in the care plan section) Acute Rehab OT Goals Patient Stated Goal: To get better  OT Goal Formulation: With patient Time For Goal Achievement: 11/29/15 Potential to Achieve Goals: Good ADL Goals Pt Will Perform Grooming: with min guard assist;standing Pt Will Perform Lower Body Bathing: with min guard assist;sit to/from stand Pt Will Perform Lower Body Dressing: with min guard assist;sit to/from stand Pt Will Transfer to Toilet: with min guard assist;ambulating;regular height toilet;bedside commode Pt Will Perform Toileting - Clothing Manipulation and hygiene: with min guard assist;sit to/from stand  OT Frequency: Min 3X/week   Barriers to D/C:            Co-evaluation              End of Session Equipment Utilized During Treatment: Gait belt Nurse Communication: Mobility status  Activity Tolerance: Patient tolerated treatment well Patient left: in bed;with call bell/phone within reach;with bed alarm set   Time: [id number] OT Time Calculation (min): 22 min Charges:  OT General Charges $OT Visit: 1 Procedure OT Evaluation $OT Eval Moderate Complexity: 1 Procedure G-Codes:     Kaitlen Redford M 11/15/2015, 4:27 PM

## 2015-11-16 ENCOUNTER — Inpatient Hospital Stay (HOSPITAL_COMMUNITY): Payer: MEDICAID

## 2015-11-16 ENCOUNTER — Inpatient Hospital Stay (HOSPITAL_COMMUNITY)
Admission: RE | Admit: 2015-11-16 | Discharge: 2015-11-20 | DRG: 057 | Disposition: A | Discharge status: Home / Self Care | Payer: Self-pay | Source: Intra-hospital | Attending: Physical Medicine & Rehabilitation | Admitting: Physical Medicine & Rehabilitation

## 2015-11-16 DIAGNOSIS — E1165 Type 2 diabetes mellitus with hyperglycemia: Secondary | ICD-10-CM

## 2015-11-16 DIAGNOSIS — I69398 Other sequelae of cerebral infarction: Principal | ICD-10-CM

## 2015-11-16 DIAGNOSIS — I63539 Cerebral infarction due to unspecified occlusion or stenosis of unspecified posterior cerebral artery: Secondary | ICD-10-CM | POA: Diagnosis present

## 2015-11-16 DIAGNOSIS — K5900 Constipation, unspecified: Secondary | ICD-10-CM

## 2015-11-16 DIAGNOSIS — I10 Essential (primary) hypertension: Secondary | ICD-10-CM | POA: Diagnosis present

## 2015-11-16 DIAGNOSIS — I161 Hypertensive emergency: Secondary | ICD-10-CM | POA: Diagnosis present

## 2015-11-16 DIAGNOSIS — F17210 Nicotine dependence, cigarettes, uncomplicated: Secondary | ICD-10-CM | POA: Diagnosis present

## 2015-11-16 DIAGNOSIS — R42 Dizziness and giddiness: Secondary | ICD-10-CM

## 2015-11-16 DIAGNOSIS — D72829 Elevated white blood cell count, unspecified: Secondary | ICD-10-CM | POA: Diagnosis present

## 2015-11-16 DIAGNOSIS — F17200 Nicotine dependence, unspecified, uncomplicated: Secondary | ICD-10-CM

## 2015-11-16 DIAGNOSIS — G936 Cerebral edema: Secondary | ICD-10-CM

## 2015-11-16 LAB — GLUCOSE, CAPILLARY
GLUCOSE-CAPILLARY: 213 mg/dL — AB (ref 65–99)
GLUCOSE-CAPILLARY: 219 mg/dL — AB (ref 65–99)
GLUCOSE-CAPILLARY: 236 mg/dL — AB (ref 65–99)
GLUCOSE-CAPILLARY: 245 mg/dL — AB (ref 65–99)
GLUCOSE-CAPILLARY: 278 mg/dL — AB (ref 65–99)
Glucose-Capillary: 278 mg/dL — ABNORMAL HIGH (ref 65–99)

## 2015-11-16 MED ORDER — INSULIN ASPART 100 UNIT/ML ~~LOC~~ SOLN
0.0000 [IU] | SUBCUTANEOUS | Status: DC
  Administered 2015-11-16: 8 [IU] via SUBCUTANEOUS
  Administered 2015-11-17: 11 [IU] via SUBCUTANEOUS
  Administered 2015-11-17: 3 [IU] via SUBCUTANEOUS
  Administered 2015-11-17 (×2): 5 [IU] via SUBCUTANEOUS
  Administered 2015-11-17: 8 [IU] via SUBCUTANEOUS
  Administered 2015-11-17 – 2015-11-18 (×2): 15 [IU] via SUBCUTANEOUS
  Administered 2015-11-18: 5 [IU] via SUBCUTANEOUS
  Administered 2015-11-18: 3 [IU] via SUBCUTANEOUS
  Administered 2015-11-18 (×2): 5 [IU] via SUBCUTANEOUS
  Administered 2015-11-18: 11 [IU] via SUBCUTANEOUS
  Administered 2015-11-19: 3 [IU] via SUBCUTANEOUS
  Administered 2015-11-19: 8 [IU] via SUBCUTANEOUS
  Administered 2015-11-19: 11 [IU] via SUBCUTANEOUS

## 2015-11-16 MED ORDER — LISINOPRIL 10 MG PO TABS
10.0000 mg | ORAL_TABLET | Freq: Every day | ORAL | Status: DC
  Administered 2015-11-17 – 2015-11-20 (×4): 10 mg via ORAL
  Filled 2015-11-16 (×4): qty 1

## 2015-11-16 MED ORDER — SORBITOL 70 % SOLN: 30.0000 mL | Freq: Every day | Status: DC | PRN

## 2015-11-16 MED ORDER — ONDANSETRON HCL 4 MG/2ML IJ SOLN: 4.0000 mg | Freq: Four times a day (QID) | INTRAMUSCULAR | Status: DC | PRN

## 2015-11-16 MED ORDER — ASPIRIN EC 81 MG PO TBEC: 81.0000 mg | DELAYED_RELEASE_TABLET | Freq: Every day | ORAL | Status: DC

## 2015-11-16 MED ORDER — INSULIN GLARGINE 100 UNIT/ML ~~LOC~~ SOLN
15.0000 [IU] | Freq: Every day | SUBCUTANEOUS | Status: DC
  Administered 2015-11-16: 15 [IU] via SUBCUTANEOUS
  Filled 2015-11-16 (×2): qty 0.15

## 2015-11-16 MED ORDER — CLOPIDOGREL BISULFATE 75 MG PO TABS
75.0000 mg | ORAL_TABLET | Freq: Every day | ORAL | Status: DC
  Administered 2015-11-16: 75 mg via ORAL
  Filled 2015-11-16: qty 1

## 2015-11-16 MED ORDER — ATORVASTATIN CALCIUM 40 MG PO TABS: 40.0000 mg | ORAL_TABLET | Freq: Every day | ORAL | Status: DC

## 2015-11-16 MED ORDER — SENNOSIDES-DOCUSATE SODIUM 8.6-50 MG PO TABS: 1.0000 | ORAL_TABLET | Freq: Every evening | ORAL | Status: DC | PRN

## 2015-11-16 MED ORDER — METOPROLOL TARTRATE 25 MG PO TABS
25.0000 mg | ORAL_TABLET | Freq: Two times a day (BID) | ORAL | Status: DC
  Administered 2015-11-16 – 2015-11-20 (×8): 25 mg via ORAL
  Filled 2015-11-16 (×8): qty 1

## 2015-11-16 MED ORDER — ACETAMINOPHEN 650 MG RE SUPP: 650.0000 mg | RECTAL | Status: DC | PRN

## 2015-11-16 MED ORDER — ONDANSETRON HCL 4 MG PO TABS: 4.0000 mg | ORAL_TABLET | Freq: Four times a day (QID) | ORAL | Status: DC | PRN

## 2015-11-16 MED ORDER — ACETAMINOPHEN 325 MG PO TABS: 650.0000 mg | ORAL_TABLET | ORAL | Status: DC | PRN

## 2015-11-16 MED ORDER — METFORMIN HCL 500 MG PO TABS: 500.0000 mg | ORAL_TABLET | Freq: Two times a day (BID) | ORAL | Status: DC

## 2015-11-16 NOTE — Progress Notes (Signed)
Ranelle Oyster, MD Physician Signed Physical Medicine and Rehabilitation Consult Note 11/15/2015 6:25 AM  Related encounter: Admission (Current) from 11/13/2015 in [facility]    Expand All Collapse All        Physical Medicine and Rehabilitation Consult Reason for Consult: Large left PICA infarct petechial hemorrhagic transformation, cerebellar edema Referring Physician: Dr. Pearlean Brownie   HPI: Derrick Robinson is a 45 y.o. right handed male with history of tobacco abuse, hypertension, diabetes mellitus. Patient on no scheduled medications at time of admission. Lives with girlfriend ankle friend's mother. 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 Girlfriend works full time but girlfriends mother can assist. One level home with 3 steps to entry. Presented 11/13/2015 with persistent dizziness, bouts of nausea and vomiting. Initially presented to [facility] where a CT of the head showed a large left-sided cerebellar stroke with some mass effect. He was transferred to [facility] for further evaluation. 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 Mild ventriculomegaly with effacement of fourth ventricle. Left vertebral artery occlusion.. Carotid Dopplers 1-39 percent bilateral ICA stenosis. Echocardiogram with ejection fraction 65% no wall motion abnormalities. Neurology consulted. Patient did not receive TPA. Currently maintained on aspirin for CVA prophylaxis. Hemoglobin A1c of 12.6 with insulin therapy initiated. Tolerating a regular diet. Physical therapy evaluation completed 11/14/2015 with recommendations of physical medicine rehabilitation consult.   Review of Systems  Constitutional: Negative for fever and chills.  HENT: Negative for hearing loss.  Respiratory: Negative for cough and shortness of breath.  Cardiovascular: Negative for chest pain,  palpitations and leg swelling.  Gastrointestinal: Positive for nausea, vomiting and constipation.  Genitourinary: Negative for dysuria and hematuria.  Skin: Negative for rash.  Neurological: Positive for dizziness, weakness and headaches. Negative for seizures.  All other systems reviewed and are negative.  Past Medical History  Diagnosis Date  . Diabetes (HCC)   . HTN (hypertension)    Past Surgical History  Procedure Laterality Date  . Foot surgery     History reviewed. No pertinent family history. Social History:  reports that he has been smoking. He does not have any smokeless tobacco history on file. He reports that he drinks alcohol. His drug history is not on file. Allergies: No Known Allergies Medications Prior to Admission  Medication Sig Dispense Refill  . Aspirin-Acetaminophen-Caffeine (GOODYS EXTRA STRENGTH PO) Take 1 packet by mouth every 6 (six) hours as needed. For pain      Home: Home Living Family/patient expects to be discharged to:: Private residence Living Arrangements: Spouse/significant other (girlfriend) Available Help at Discharge: Available PRN/intermittently, Family (girlfriend works full time) Type of Home: House Home Access: Stairs to enter Secretary/administrator of Steps: 3 Entrance Stairs-Rails: None (girlfriend reports one can be put in if needed) Home Layout: One level Bathroom Shower/Tub: Engineer, manufacturing systems: Standard Home Equipment: Environmental consultant - 2 wheels, Bedside commode, Shower seat (family's equipment) Lives With: Significant other (girlfriend)  Functional History: Prior Function Level of Independence: Independent Comments: works as a Administrator Status:  Mobility: Bed Mobility Overal bed mobility: Needs Assistance Bed Mobility: Supine to Sit, Sit to Supine Supine to sit: Supervision Sit to supine: Supervision General bed mobility comments: supervision for safety as pt moves quickly  and has balance deficits and safety issues Transfers Overall transfer level: Needs assistance Equipment used: 1 person hand held assist Transfers: Sit to/from Stand Sit to Stand: Min assist General transfer  comment: Min assist to stand EOB with legs resting against bed for stability. Pt initially thrusts forward and then over compensates and leans backwards in standing. (+) sway in static standing.  Ambulation/Gait Ambulation/Gait assistance: +2 physical assistance, Mod assist Ambulation Distance (Feet): 180 Feet Assistive device: 2 person hand held assist Gait Pattern/deviations: Ataxic, Staggering left, Staggering right General Gait Details: Pt with staggering and ataxic gait pattern. Anterior trunk, scissoring feet at times. Verbal cues to slow down, get his feet under him.     ADL:    Cognition: Cognition Overall Cognitive Status: Impaired/Different from baseline Arousal/Alertness: Awake/alert Orientation Level: Oriented X4 Attention: Sustained, Focused Focused Attention: Appears intact Sustained Attention: Appears intact Memory: Appears intact Awareness: Appears intact Problem Solving: Appears intact Safety/Judgment: Appears intact Cognition Arousal/Alertness: Awake/alert Behavior During Therapy: Impulsive Overall Cognitive Status: Impaired/Different from baseline Area of Impairment: Safety/judgement, Awareness Safety/Judgement: Decreased awareness of safety Awareness: Intellectual  Blood pressure 148/83, pulse 58, temperature 98.4 F (36.9 C), temperature source Oral, resp. rate 11, height 5\' 11"  (1.803 m), weight 79.7 kg (175 lb 11.3 oz), SpO2 100 %. Physical Exam  Vitals reviewed. Constitutional: He is oriented to person, place, and time. He appears well-developed.  HENT:  Head: Normocephalic.  Eyes: EOM are normal.  Neck: Normal range of motion. Neck supple. No thyromegaly present.  Cardiovascular: Normal rate and regular rhythm.  Respiratory: Effort  normal and breath sounds normal. No respiratory distress.  GI: Soft. Bowel sounds are normal. He exhibits no distension.  Neurological: He is alert and oriented to person, place, and time.  Mood is flat but appropriate. He makes good eye contact with examiner. Patient with fair awareness of deficits. Mild limb ataxia. No diplopia. Visual fields in tact. Difficulty staying awake. Fair insight and awareness. Strength grossly 4/5 prox to distal in all 4  Skin: Skin is warm and dry.     Lab Results Last 24 Hours    Results for orders placed or performed during the hospital encounter of 11/13/15 (from the past 24 hour(s))  Glucose, capillary Status: Abnormal   Collection Time: 11/14/15 8:11 AM  Result Value Ref Range   Glucose-Capillary 186 (H) 65 - 99 mg/dL   Comment 1 Notify RN    Comment 2 Document in Chart   Glucose, capillary Status: None   Collection Time: 11/14/15 10:59 AM  Result Value Ref Range   Glucose-Capillary 86 65 - 99 mg/dL   Comment 1 Notify RN    Comment 2 Document in Chart   Glucose, capillary Status: Abnormal   Collection Time: 11/14/15 11:48 AM  Result Value Ref Range   Glucose-Capillary 181 (H) 65 - 99 mg/dL   Comment 1 Notify RN    Comment 2 Document in Chart   Glucose, capillary Status: Abnormal   Collection Time: 11/14/15 4:02 PM  Result Value Ref Range   Glucose-Capillary 248 (H) 65 - 99 mg/dL   Comment 1 Notify RN    Comment 2 Document in Chart   Glucose, capillary Status: Abnormal   Collection Time: 11/14/15 7:37 PM  Result Value Ref Range   Glucose-Capillary 284 (H) 65 - 99 mg/dL  Glucose, capillary Status: Abnormal   Collection Time: 11/14/15 11:37 PM  Result Value Ref Range   Glucose-Capillary 259 (H) 65 - 99 mg/dL  CBC Status: Abnormal   Collection Time: 11/15/15 3:07 AM  Result Value Ref Range   WBC 15.5 (H) 4.0  - 10.5 K/uL   RBC 5.30 4.22 - 5.81 MIL/uL   Hemoglobin  16.0 13.0 - 17.0 g/dL   HCT 16.1 09.6 - 04.5 %   MCV 85.8 78.0 - 100.0 fL   MCH 30.2 26.0 - 34.0 pg   MCHC 35.2 30.0 - 36.0 g/dL   RDW 40.9 81.1 - 91.4 %   Platelets 300 150 - 400 K/uL  Magnesium Status: None   Collection Time: 11/15/15 3:07 AM  Result Value Ref Range   Magnesium 2.4 1.7 - 2.4 mg/dL  Phosphorus Status: None   Collection Time: 11/15/15 3:07 AM  Result Value Ref Range   Phosphorus 2.6 2.5 - 4.6 mg/dL  Glucose, capillary Status: Abnormal   Collection Time: 11/15/15 3:24 AM  Result Value Ref Range   Glucose-Capillary 169 (H) 65 - 99 mg/dL      Imaging Results (Last 48 hours)    Ct Head Wo Contrast  11/13/2015 CLINICAL DATA: Cerebellar stroke. Vertigo. Left hand clumsiness for 2 days. Patient transferred from [facility] earlier today. EXAM: CT HEAD WITHOUT CONTRAST TECHNIQUE: Contiguous axial images were obtained from the base of the skull through the vertex without intravenous contrast. COMPARISON: Head CT earlier this day at 943 hour at [facility]. FINDINGS: Decreased attenuation in the left cerebellum causing effacement of the fourth ventricle and mass effect on the midbrain, unchanged in degree and appearance from prior exam. No hemorrhagic transformation. There is tonsillar crowding unchanged. Overall size and extent is unchanged. No developing hydrocephalus. No evidence of supratentorial infarct or acute ischemia. Paranasal sinus disease with complete opacification of the frontal sinuses, left maxillary sinus, and majority of the ethmoid air cells. Mastoid air cells are well aerated. IMPRESSION: Unchanged appearance of the large left cerebellar infarct from exam 12 hours prior causing effacement of the fourth ventricle and mass effect on the midbrain. No hemorrhagic transformation. Electronically Signed By: Rubye Oaks  M.D. On: 11/13/2015 23:04   Mr Brain Wo Contrast  11/14/2015 CLINICAL DATA: Dizziness and headache, follow-up cerebellar stroke. 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 History of the hypertension and diabetes. EXAM: MRI HEAD WITHOUT CONTRAST TECHNIQUE: Multiplanar, multiecho pulse sequences of the brain and surrounding structures were obtained without intravenous contrast. COMPARISON: CT head November 13, 2015 FINDINGS: Multiple sequences are moderately motion degraded, axial T1 is severely motion degraded. INTRACRANIAL CONTENTS: Wedge-like area of reduced diffusion LEFT inferior cerebellar with corresponding low ADC values. T2 bright cytotoxic edema LEFT cerebellum and susceptibility artifact. Mild ventriculomegaly with effaced fourth ventricle. No convincing evidence of interstitial edema/ transependymal flow cerebral spinal fluid. Effaced pre pontine cistern with mild upward and downward herniation of the cerebellum. Patchy supratentorial white matter FLAIR T2 hyperintensities. No abnormal extra-axial fluid collections. No extra-axial masses though, contrast enhanced sequences would be more sensitive. Loss of LEFT vertebral artery flow void. ORBITS: The included ocular globes and orbital contents are non-suspicious. SINUSES: Moderate to severe paranasal sinusitis with LEFT maxillary sinus reduced diffusion concerning for fungal sinusitis. The mastoid air cells are well aerated. SKULL/SOFT TISSUES: Partially empty sella. No suspicious calvarial bone marrow signal. Craniocervical junction maintained. IMPRESSION: Motion degraded examination. 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 Mild ventriculomegaly, in part due to effacement of fourth ventricle. Mild cerebellar herniation and effaced pre pontine cistern. LEFT vertebral artery occlusion better seen on CTA from 1 day prior reported separately. Mild to  moderate white matter changes compatible with chronic small vessel ischemic disease. Electronically Signed By: Awilda Metro M.D. On: 11/14/2015 04:59     Assessment/Plan: Diagnosis: left PICA infarct 1. Does  the need for close, 24 hr/day medical supervision in concert with the patient's rehab needs make it unreasonable for this patient to be served in a less intensive setting? Yes 2. Co-Morbidities requiring supervision/potential complications: htn, dm 3. Due to bladder management, bowel management, safety, skin/wound care, disease management, medication administration and pain management, does the patient require 24 hr/day rehab nursing? Yes 4. Does the patient require coordinated care of a physician, rehab nurse, PT (1-2 hrs/day, 5 days/week), OT (1-2 hrs/day, 5 days/week) and SLP (1-2 hrs/day, 5 days/week) to address physical and functional deficits in the context of the above medical diagnosis(es)? Yes Addressing deficits in the following areas: balance, endurance, locomotion, strength, transferring, bowel/bladder control, bathing, dressing, feeding, grooming, toileting, cognition and psychosocial support 5. Can the patient actively participate in an intensive therapy program of at least 3 hrs of therapy per day at least 5 days per week? Yes and Potentially 6. The potential for patient to make measurable gains while on inpatient rehab is excellent 7. Anticipated functional outcomes upon discharge from inpatient rehab are modified independent with PT, modified independent and supervision with OT, modified independent with SLP. 8. Estimated rehab length of stay to reach the above functional goals is: 7-10 days 9. Does the patient have adequate social supports and living environment to accommodate these discharge functional goals? Yes 10. Anticipated D/C setting: Home 11. Anticipated post D/C treatments: HH therapy and Outpatient therapy 12. Overall Rehab/Functional Prognosis:  excellent  RECOMMENDATIONS: This patient's condition is appropriate for continued rehabilitative care in the following setting: CIR Patient has agreed to participate in recommended program. Yes Note that insurance prior authorization may be required for reimbursement for recommended care.  Comment: Rehab Admissions Coordinator to follow up.  Thanks,  Ranelle Oyster, MD, [facility]     11/15/2015       Revision History     Date/Time User Provider Type Action   11/15/2015 10:21 AM Ranelle Oyster, MD Physician Sign   11/15/2015 6:51 AM Charlton Amor, PA-C Physician Assistant Pend   View Details Report       Routing History     Date/Time From To Method   11/15/2015 10:21 AM Ranelle Oyster, MD No Pcp Per Patient In Basket

## 2015-11-16 NOTE — PMR Pre-admission (Signed)
PMR Admission Coordinator Pre-Admission Assessment  Patient: Derrick Robinson is an 45 y.o., male MRN: [id number] DOB: [birth year] Height:  (180.3 cm) Weight: 79.7 kg (175 lb 11.3 oz)              Insurance Information HMO:     PPO:      PCP:      IPA:      80/20:      OTHER:  PRIMARY: Uninsured       Self employed  Medicaid Application Date:       Case Manager:  Disability Application Date:       Case Worker:   Emergency Conservator, museum/gallery Information    Name Relation Home Work Mobile   Coronita Significant other   [phone]   [place],Margaret Mother [phone]  [phone]   Orva, [place] Daughter   [phone]     Current Medical History   Patient Admitting Diagnosis: 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 right handed male with history of tobacco abuse, hypertension, diabetes mellitus. Patient on no scheduled medications at time of admission. Presented 11/13/2015 with persistent dizziness, bouts of nausea and vomiting. Initially presented to [facility] where a CT of the head showed a large left-sided cerebellar stroke with some mass effect. He was transferred to [facility] for further evaluation. 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 Mild ventriculomegaly with effacement of fourth ventricle. Left vertebral artery occlusion.. Intravenous Cardene initiated for blood pressure control. Carotid Dopplers 1-39 percent bilateral ICA stenosis. Echocardiogram with ejection fraction 65% no wall motion abnormalities. Neurology consulted. Patient did not receive TPA. Currently maintained on aspirin for CVA prophylaxis. Hemoglobin A1c of 12.6 with insulin therapy initiated. Tolerating a regular diet.   Total: 3 NIH    Past Medical History  Past Medical History   Diagnosis Date  . Diabetes (HCC)   . HTN (hypertension)    Patient states he was aware he had DM, had lost 50 lbs and felt it was under control. New dx of HTN this admit per pt.  Family History  family history is not on file.  Prior Rehab/Hospitalizations:  Has the patient had major surgery during 100 days prior to admission? No  Current Medications   Current facility-administered medications:  .  0.9 %  sodium chloride infusion, , Intravenous, Continuous, Rahul P Desai, PA-C, Stopped at 11/15/15 1000 .  acetaminophen (TYLENOL) tablet 650 mg, 650 mg, Oral, Q4H PRN **OR** acetaminophen (TYLENOL) suppository 650 mg, 650 mg, Rectal, Q4H PRN, Haydee Salter, MD .  Melene Muller ON 11/17/2015] aspirin EC tablet 81 mg, 81 mg, Oral, Daily, Layne Benton, NP .  atorvastatin (LIPITOR) tablet 40 mg, 40 mg, Oral, q1800, Layne Benton, NP .  clopidogrel (PLAVIX) tablet 75 mg, 75 mg, Oral, Daily, Layne Benton, NP .  hydrALAZINE (APRESOLINE) injection 10 mg, 10 mg, Intravenous, Q6H PRN, Haydee Salter, MD, 10 mg at 11/14/15 2205 .  insulin aspart (novoLOG) injection 0-15 Units, 0-15 Units, Subcutaneous, Q4H, Haydee Salter, MD, 8 Units at 11/16/15 1208 .  labetalol (NORMODYNE,TRANDATE) injection 10-40 mg, 10-40 mg, Intravenous, Q2H PRN, Heron Sabins, MD, 10 mg at 11/15/15 0220 .  lisinopril (PRINIVIL,ZESTRIL) tablet 10 mg, 10 mg, Oral, Daily, Heron Sabins, MD, 10 mg at 11/16/15 1027 .  metoprolol tartrate (LOPRESSOR) tablet 25 mg, 25 mg, Oral, BID, Pramod S Sethi,  MD, 25 mg at 11/16/15 1027 .  senna-docusate (Senokot-S) tablet 1 tablet, 1 tablet, Oral, QHS PRN, Haydee SalterPhillip M Hobbs, MD  Patients Current Diet: Diet Carb Modified Fluid consistency:: Thin; Room service appropriate?: Yes  Precautions / Restrictions Precautions Precautions: Fall Precaution Comments: significant balance deficits and poor safety awareness and awareness of deficits.  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  Home Assistive Devices / Equipment Home Assistive Devices/Equipment: Eyeglasses Home Equipment: Walker - 2 wheels, Bedside commode, Shower seat (family's equipment)  Prior Device Use: Indicate devices/aids used by the patient prior to current illness, exacerbation or injury? None of the above  Prior Functional Level Prior Function Level of Independence: Independent Comments: works as a Advice workercarpenter  Self Care: Did the patient need help bathing, dressing, using the toilet or eating?  Independent  Indoor Mobility: Did the patient need assistance with walking from room to room (with or without device)? Independent  Stairs: Did the patient need assistance with internal or external stairs (with or without device)? Independent  Functional Cognition: Did the patient need help planning regular tasks such as shopping or remembering to take medications? Independent  Current Functional Level Cognition  Arousal/Alertness: Awake/alert Overall Cognitive Status: Impaired/Different from baseline Orientation Level: Oriented X4 Safety/Judgement: Decreased awareness of safety General Comments: Requires verbal cues to slow down when he beings losing his balance while ambulating, appearing unaware of his deficits. Attention: Sustained, Focused Focused Attention: Appears intact Sustained Attention: Appears intact Memory: Appears intact Awareness: Appears intact Problem Solving: Appears intact Safety/Judgment: Appears intact    Extremity Assessment (includes Sensation/Coordination)  Upper Extremity Assessment: RUE deficits/detail, LUE deficits/detail RUE Deficits / Details: mild dysmetria  RUE Coordination: decreased fine  motor, decreased gross motor LUE Deficits / Details: mild dysmetria  LUE Coordination: decreased fine motor, decreased gross motor  Lower Extremity Assessment: Defer to PT evaluation RLE Deficits / Details: strength 5/5/ throughout  LLE Deficits / Details: strength 5/5 throughout    ADLs  Overall ADL's : Needs assistance/impaired Eating/Feeding: Modified independent Grooming: Wash/dry hands, Wash/dry face, Oral care, Brushing hair, Set up, Supervision/safety, Sitting Upper Body Bathing: Supervision/ safety, Set up, Sitting Lower Body Bathing: Moderate assistance, Sit to/from stand Upper Body Dressing : Set up, Supervision/safety, Sitting Lower Body Dressing: Moderate assistance, Sit to/from stand Lower Body Dressing Details (indicate cue type and reason): Pt requires assist for balance in standing  Toilet Transfer: Minimal assistance, Moderate assistance, Ambulation, Comfort height toilet, Regular Toilet Toilet Transfer Details (indicate cue type and reason): Pt with LOB requring mod A to recover  Toileting- Clothing Manipulation and Hygiene: Moderate assistance, Sit to/from stand Functional mobility during ADLs: Minimal assistance, Moderate assistance General ADL Comments: pt requires assist due to impaired balance     Mobility  Overal bed mobility: Needs Assistance Bed Mobility: Supine to Sit, Sit to Supine Supine to sit: Supervision Sit to supine: Supervision General bed mobility comments: supervision for safety     Transfers  Overall transfer level: Needs assistance Equipment used: Rolling walker (2 wheeled) Transfers: Sit to/from Stand Sit to Stand: Min assist Stand pivot transfers: Min assist General transfer comment: Min assist for balance when going to stand.  Lordotic posture and weight shifting back  to his heels before steadying w/ assist.    Ambulation / Gait / Stairs / Wheelchair Mobility  Ambulation/Gait Ambulation/Gait assistance: Mod assist Ambulation Distance  (Feet): 200 Feet Assistive device: Rolling walker (2 wheeled) Gait Pattern/deviations: Step-through pattern, Ataxic, Staggering right, Staggering left, Narrow base of support, Scissoring General Gait Details: Pt spontaneously staggering Lt or Rt, requring mod assist to steady.  Ataxic and scissoring gait.  Balance improved w/ cues for bracing his core and after weight shifting exercise as documented below. Gait velocity: Cues to slow down when he begins to lose his balance Gait velocity interpretation: at or above normal speed for age/gender    Posture / Balance Balance Overall balance assessment: Needs assistance Sitting-balance support: Feet supported, No upper extremity supported Sitting balance-Leahy Scale: Fair Standing balance support: No upper extremity supported, During functional activity Standing balance-Leahy Scale: Poor Standing balance comment: Min assist when pt adjusting his gown w/ both hands    Special needs/care consideration Bowel mgmt: continent Bladder mgmt: continent Diabetic mgmt Hgb A1c 12.6. Pt was aware he had DM pta but has lost 50 lbs and thought it was controlled. Has no PCP and no CBG machine so was not monitoring his CBGS. New dx of HTN this admission Bed alarm for decreased safety awareness   Previous Home Environment Living Arrangements: Spouse/significant other (GF, Buyer, retail)  Lives With: Significant other Available Help at Discharge: Available PRN/intermittently Type of Home: House Home Layout: One level Home Access: Stairs to enter Entrance Stairs-Rails: None Entrance Stairs-Number of Steps: 3 Bathroom Shower/Tub: Associate Professor: Yes How Accessible: Accessible via walker Home Care Services: No  Discharge Living Setting Plans for Discharge Living Setting: Patient's home, Lives with (comment) (GF, [name]) Type of Home at Discharge: House Discharge Home Layout: One level Discharge Home Access: Stairs  to enter Entrance Stairs-Rails: None Entrance Stairs-Number of Steps: 3 Discharge Bathroom Shower/Tub: Walk-in shower Discharge Bathroom Toilet: Standard Discharge Bathroom Accessibility: Yes How Accessible: Accessible via walker Does the patient have any problems obtaining your medications?: Yes (Describe) (uninsured, yes. NO PCP either)  Social/Family/Support Systems Patient Roles: Partner, Parent (1 dtr 37 yo, 3 boys teenagers) Contact Information: Buyer, retail, and Mom Anticipated Caregiver: [name] and Mom Anticipated Industrial/product designer Information: see above Ability/Limitations of Caregiver: [name] works, Mom is caregiver for her husband, Museum/gallery conservator Mom can assist Caregiver Availability:  (close to 24/7 if needed) Discharge Plan Discussed with Primary Caregiver: Yes Is Caregiver In Agreement with Plan?: Yes Does Caregiver/Family have Issues with Lodging/Transportation while Pt is in Rehab?: No  Goals/Additional Needs Patient/Family Goal for Rehab: Mod I to supervision with PT, OT, and SLP Expected length of stay: ELOS 7-10 days Special Service Needs: Pt has no PCP and is uninsured Pt/Family Agrees to Admission and willing to participate: Yes Program Orientation Provided & Reviewed with Pt/Caregiver Including Roles  & Responsibilities: Yes  Decrease burden of Care through IP rehab admission: n/a  Possible need for SNF placement upon discharge:not anticipated  Patient Condition: This patient's condition remains as documented in the consult dated 11/15/2015, in which the Rehabilitation Physician determined and documented that the patient's condition is appropriate for intensive rehabilitative care in an inpatient rehabilitation facility. Will admit to inpatient rehab today.  Preadmission Screen Completed By:  Clois Dupes, 11/16/2015 1:19 PM ______________________________________________________________________   Discussed status with Dr. Riley Kill on 11/16/2015 at  1319 and received  telephone approval for admission today.  Admission Coordinator:  Clois Dupes, time 1610 Date 11/16/2015

## 2015-11-16 NOTE — Progress Notes (Signed)
I met with pt and his Mom at bedside. He is in agreement to admit to inpt rehab today. I will make the arrangements. RN CM and SW made aware. [phone]

## 2015-11-16 NOTE — H&P (View-Only) (Signed)
Physical Medicine and Rehabilitation Admission H&P    Chief complaint: Headache  ERD:EYCXKGY Derrick Robinson is a 45 y.o. right handed male with history of tobacco abuse, hypertension, diabetes mellitus. Patient on no scheduled medications at time of admission. Lives with girlfriend ankle friend's mother. 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 One level home with 3 steps to entry. Presented 11/13/2015 with persistent dizziness, bouts of nausea and vomiting. Initially presented to [facility] where a CT of the head showed a large left-sided cerebellar stroke with some mass effect. He was transferred to [facility] for further evaluation. 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 Mild ventriculomegaly with effacement of fourth ventricle. Left vertebral artery occlusion.. Intravenous Cardene initiated for blood pressure control. Carotid Dopplers 1-39 percent bilateral ICA stenosis. Echocardiogram with ejection fraction 65% no wall motion abnormalities. Neurology consulted. Patient did not receive TPA. Currently maintained on aspirin for CVA prophylaxis. Hemoglobin A1c of 12.6 with insulin therapy initiated. Tolerating a regular diet. Physical therapy evaluation completed 11/14/2015 with recommendations of physical medicine rehabilitation consult. Patient was admitted for a comprehensive rehabilitation program   ROS Constitutional: Negative for fever and chills.  HENT: Negative for hearing loss.  Respiratory: Negative for cough and shortness of breath.  Cardiovascular: Negative for chest pain, palpitations and leg swelling.  Gastrointestinal: Positive for nausea, vomiting and constipation.  Genitourinary: Negative for dysuria and hematuria.  Skin: Negative for rash.  Neurological: Positive for dizziness, weakness and headaches. Negative for  seizures.  All other systems reviewed and are negative   Past Medical History  Diagnosis Date  . Diabetes ([place])   . HTN (hypertension)    Past Surgical History  Procedure Laterality Date  . Foot surgery     History reviewed. No pertinent family history. Social History:  reports that he has been smoking.  He does not have any smokeless tobacco history on file. He reports that he drinks alcohol. His drug history is not on file. Allergies: No Known Allergies Medications Prior to Admission  Medication Sig Dispense Refill  . Aspirin-Acetaminophen-Caffeine (GOODYS EXTRA STRENGTH PO) Take 1 packet by mouth every 6 (six) hours as needed. For pain      Home: Home Living Family/patient expects to be discharged to:: Private residence Living Arrangements: Spouse/significant other (girlfriend) Available Help at Discharge: Available PRN/intermittently, Family (girlfriend works full time) Type of Home: House Home Access: Stairs to enter Technical brewer of Steps: 3 Entrance Stairs-Rails: None (girlfriend reports one can be put in if needed) Home Layout: One level Bathroom Shower/Tub: Chiropodist: [place]: Environmental consultant - 2 wheels, Bedside commode, Shower seat (family's equipment)  Lives With: Significant other (girlfriend)   Functional History: Prior Function Level of Independence: Independent Comments: works as a Counsellor Status:  Mobility: Bed Mobility Overal bed mobility: Needs Assistance Bed Mobility: Supine to Sit, Sit to Supine Supine to sit: Supervision Sit to supine: Supervision General bed mobility comments: supervision for safety as pt moves quickly and has balance deficits and safety issues Transfers Overall transfer level: Needs assistance Equipment used: 1 person hand held assist Transfers: Sit to/from Stand Sit to Stand: Min assist General transfer comment: Min assist to stand EOB with legs resting against bed for  stability. Pt initially thrusts forward and then over compensates and leans backwards in standing.  (+) sway in static standing.  Ambulation/Gait Ambulation/Gait assistance: +2  physical assistance, Mod assist Ambulation Distance (Feet): 180 Feet Assistive device: 2 person hand held assist Gait Pattern/deviations: Ataxic, Staggering left, Staggering right General Gait Details: Pt with staggering and ataxic gait pattern.  Anterior trunk, scissoring feet at times.  Verbal cues to slow down, get his feet under him.      ADL:    Cognition: Cognition Overall Cognitive Status: Impaired/Different from baseline Arousal/Alertness: Awake/alert Orientation Level: Oriented X4 Attention: Sustained, Focused Focused Attention: Appears intact Sustained Attention: Appears intact Memory: Appears intact Awareness: Appears intact Problem Solving: Appears intact Safety/Judgment: Appears intact Cognition Arousal/Alertness: Awake/alert Behavior During Therapy: Impulsive Overall Cognitive Status: Impaired/Different from baseline Area of Impairment: Safety/judgement, Awareness Safety/Judgement: Decreased awareness of safety Awareness: Intellectual  Physical Exam: Blood pressure 143/72, pulse 99, temperature 97.9 F (36.6 C), temperature source Oral, resp. rate 16, height 5' 11"  (1.803 m), weight 79.7 kg (175 lb 11.3 oz), SpO2 99 %. Physical Exam Constitutional: He is oriented to person, place, and time. He appears well-developed.  HENT:  Head: Normocephalic.  Eyes: EOM are normal.  Neck: Normal range of motion. Neck supple. No thyromegaly present.  Cardiovascular: Normal rate and regular rhythm. no murmur Respiratory: Effort normal and breath sounds normal. No respiratory distress. No wheezes or rales GI: Soft. Bowel sounds are normal. He exhibits no distension.  Neurological: He is alert and oriented to person, place, and time.  Mood is flat but appropriate. improved eye contact with examiner.  Patient with fair awareness of deficits. Mild limb ataxia, left more than right. No diplopia. Visual fields in tact. Stands and has difficulty maintaining position/posture, truncal ataxia noted. Fair insight and awareness. Strength grossly 4/5 prox to distal in all 4 limbs. No sensory deficits. DTR's 1+ Skin: Skin is warm and dry Psych: pleasant and cooperative   Results for orders placed or performed during the hospital encounter of 11/13/15 (from the past 48 hour(s))  CBC     Status: Abnormal   Collection Time: 11/13/15 11:32 PM  Result Value Ref Range   WBC 17.1 (H) 4.0 - 10.5 K/uL   RBC 5.42 4.22 - 5.81 MIL/uL   Hemoglobin 16.6 13.0 - 17.0 g/dL   HCT 47.2 39.0 - 52.0 %   MCV 87.1 78.0 - 100.0 fL   MCH 30.6 26.0 - 34.0 pg   MCHC 35.2 30.0 - 36.0 g/dL   RDW 12.4 11.5 - 15.5 %   Platelets 285 150 - 400 K/uL  Comprehensive metabolic panel     Status: Abnormal   Collection Time: 11/13/15 11:32 PM  Result Value Ref Range   Sodium 135 135 - 145 mmol/L   Potassium 4.2 3.5 - 5.1 mmol/L   Chloride 100 (L) 101 - 111 mmol/L   CO2 24 22 - 32 mmol/L   Glucose, Bld 265 (H) 65 - 99 mg/dL   BUN 15 6 - 20 mg/dL   Creatinine, Ser 0.80 0.61 - 1.24 mg/dL   Calcium 9.2 8.9 - 10.3 mg/dL   Total Protein 7.0 6.5 - 8.1 g/dL   Albumin 3.4 (L) 3.5 - 5.0 g/dL   AST 18 15 - 41 U/L   ALT 26 17 - 63 U/L   Alkaline Phosphatase 108 38 - 126 U/L   Total Bilirubin 1.1 0.3 - 1.2 mg/dL   GFR calc non Af Amer >60 >60 mL/min   GFR calc Af Amer >60 >60 mL/min    Comment: (NOTE) The eGFR has been calculated using the CKD EPI equation. This calculation has not been validated  in all clinical situations. eGFR's persistently <60 mL/min signify possible Chronic Kidney Disease.    Anion gap 11 5 - 15  Protime-INR     Status: None   Collection Time: 11/13/15 11:32 PM  Result Value Ref Range   Prothrombin Time 14.4 11.6 - 15.2 seconds   INR 1.10 0.00 - 1.49  APTT     Status: None   Collection Time: 11/13/15  11:32 PM  Result Value Ref Range   aPTT 28 24 - 37 seconds  MRSA PCR Screening     Status: None   Collection Time: 11/14/15 12:13 AM  Result Value Ref Range   MRSA by PCR NEGATIVE NEGATIVE    Comment:        The GeneXpert MRSA Assay (FDA approved for NASAL specimens only), is one component of a comprehensive MRSA colonization surveillance program. It is not intended to diagnose MRSA infection nor to guide or monitor treatment for MRSA infections.   Glucose, capillary     Status: Abnormal   Collection Time: 11/14/15 12:50 AM  Result Value Ref Range   Glucose-Capillary 218 (H) 65 - 99 mg/dL  Urine rapid drug screen (hosp performed)     Status: Abnormal   Collection Time: 11/14/15  1:25 AM  Result Value Ref Range   Opiates POSITIVE (A) NONE DETECTED   Cocaine NONE DETECTED NONE DETECTED   Benzodiazepines NONE DETECTED NONE DETECTED   Amphetamines POSITIVE (A) NONE DETECTED   Tetrahydrocannabinol NONE DETECTED NONE DETECTED   Barbiturates NONE DETECTED NONE DETECTED    Comment:        DRUG SCREEN FOR MEDICAL PURPOSES ONLY.  IF CONFIRMATION IS NEEDED FOR ANY PURPOSE, NOTIFY LAB WITHIN 5 DAYS.        LOWEST DETECTABLE LIMITS FOR URINE DRUG SCREEN Drug Class       Cutoff (ng/mL) Amphetamine      1000 Barbiturate      200 Benzodiazepine   878 Tricyclics       676 Opiates          300 Cocaine          300 THC              50   Urinalysis, Routine w reflex microscopic (not at [facility])     Status: Abnormal   Collection Time: 11/14/15  1:25 AM  Result Value Ref Range   Color, Urine STRAW (A) YELLOW   APPearance CLEAR CLEAR   Specific Gravity, Urine 1.027 1.005 - 1.030   pH 5.5 5.0 - 8.0   Glucose, UA >1000 (A) NEGATIVE mg/dL   Hgb urine dipstick NEGATIVE NEGATIVE   Bilirubin Urine NEGATIVE NEGATIVE   Ketones, ur >80 (A) NEGATIVE mg/dL   Protein, ur NEGATIVE NEGATIVE mg/dL   Nitrite NEGATIVE NEGATIVE   Leukocytes, UA NEGATIVE NEGATIVE  Urine microscopic-add on      Status: Abnormal   Collection Time: 11/14/15  1:25 AM  Result Value Ref Range   Squamous Epithelial / LPF 0-5 (A) NONE SEEN   WBC, UA 0-5 0 - 5 WBC/hpf   RBC / HPF 0-5 0 - 5 RBC/hpf   Bacteria, UA NONE SEEN NONE SEEN  Glucose, capillary     Status: Abnormal   Collection Time: 11/14/15  3:33 AM  Result Value Ref Range   Glucose-Capillary 255 (H) 65 - 99 mg/dL  Hemoglobin A1c     Status: Abnormal   Collection Time: 11/14/15  5:18 AM  Result Value Ref Range  Hgb A1c MFr Bld 12.6 (H) 4.8 - 5.6 %    Comment: (NOTE)         Pre-diabetes: 5.7 - 6.4         Diabetes: >6.4         Glycemic control for adults with diabetes: <7.0    Mean Plasma Glucose 315 mg/dL    Comment: (NOTE) Performed At: [facility] [place], [place] [id number] Lindon Romp MD OV:[id number]   Lipid panel     Status: Abnormal   Collection Time: 11/14/15  5:18 AM  Result Value Ref Range   Cholesterol 229 (H) 0 - 200 mg/dL   Triglycerides 140 <150 mg/dL   HDL 60 >40 mg/dL   Total CHOL/HDL Ratio 3.8 RATIO   VLDL 28 0 - 40 mg/dL   LDL Cholesterol 141 (H) 0 - 99 mg/dL    Comment:        Total Cholesterol/HDL:CHD Risk Coronary Heart Disease Risk Table                     Men   Women  1/2 Average Risk   3.4   3.3  Average Risk       5.0   4.4  2 X Average Risk   9.6   7.1  3 X Average Risk  23.4   11.0        Use the calculated Patient Ratio above and the CHD Risk Table to determine the patient's CHD Risk.        ATP III CLASSIFICATION (LDL):  <100     mg/dL   Optimal  100-129  mg/dL   Near or Above                    Optimal  130-159  mg/dL   Borderline  160-189  mg/dL   High  >190     mg/dL   Very High   Basic metabolic panel     Status: Abnormal   Collection Time: 11/14/15  5:18 AM  Result Value Ref Range   Sodium 136 135 - 145 mmol/L   Potassium 3.6 3.5 - 5.1 mmol/L   Chloride 99 (L) 101 - 111 mmol/L   CO2 24 22 - 32 mmol/L   Glucose, Bld 235 (H) 65 - 99 mg/dL   BUN  18 6 - 20 mg/dL   Creatinine, Ser 0.71 0.61 - 1.24 mg/dL   Calcium 9.1 8.9 - 10.3 mg/dL   GFR calc non Af Amer >60 >60 mL/min   GFR calc Af Amer >60 >60 mL/min    Comment: (NOTE) The eGFR has been calculated using the CKD EPI equation. This calculation has not been validated in all clinical situations. eGFR's persistently <60 mL/min signify possible Chronic Kidney Disease.    Anion gap 13 5 - 15  Glucose, capillary     Status: Abnormal   Collection Time: 11/14/15  8:11 AM  Result Value Ref Range   Glucose-Capillary 186 (H) 65 - 99 mg/dL   Comment 1 Notify RN    Comment 2 Document in Chart   Glucose, capillary     Status: None   Collection Time: 11/14/15 10:59 AM  Result Value Ref Range   Glucose-Capillary 86 65 - 99 mg/dL   Comment 1 Notify RN    Comment 2 Document in Chart   Glucose, capillary     Status: Abnormal   Collection Time: 11/14/15 11:48 AM  Result Value  Ref Range   Glucose-Capillary 181 (H) 65 - 99 mg/dL   Comment 1 Notify RN    Comment 2 Document in Chart   Glucose, capillary     Status: Abnormal   Collection Time: 11/14/15  4:02 PM  Result Value Ref Range   Glucose-Capillary 248 (H) 65 - 99 mg/dL   Comment 1 Notify RN    Comment 2 Document in Chart   Glucose, capillary     Status: Abnormal   Collection Time: 11/14/15  7:37 PM  Result Value Ref Range   Glucose-Capillary 284 (H) 65 - 99 mg/dL  Glucose, capillary     Status: Abnormal   Collection Time: 11/14/15 11:37 PM  Result Value Ref Range   Glucose-Capillary 259 (H) 65 - 99 mg/dL  CBC     Status: Abnormal   Collection Time: 11/15/15  3:07 AM  Result Value Ref Range   WBC 15.5 (H) 4.0 - 10.5 K/uL   RBC 5.30 4.22 - 5.81 MIL/uL   Hemoglobin 16.0 13.0 - 17.0 g/dL   HCT 45.5 39.0 - 52.0 %   MCV 85.8 78.0 - 100.0 fL   MCH 30.2 26.0 - 34.0 pg   MCHC 35.2 30.0 - 36.0 g/dL   RDW 12.1 11.5 - 15.5 %   Platelets 300 150 - 400 K/uL  Magnesium     Status: None   Collection Time: 11/15/15  3:07 AM  Result  Value Ref Range   Magnesium 2.4 1.7 - 2.4 mg/dL  Phosphorus     Status: None   Collection Time: 11/15/15  3:07 AM  Result Value Ref Range   Phosphorus 2.6 2.5 - 4.6 mg/dL  Glucose, capillary     Status: Abnormal   Collection Time: 11/15/15  3:24 AM  Result Value Ref Range   Glucose-Capillary 169 (H) 65 - 99 mg/dL  Glucose, capillary     Status: Abnormal   Collection Time: 11/15/15  8:00 AM  Result Value Ref Range   Glucose-Capillary 260 (H) 65 - 99 mg/dL   Ct Head Wo Contrast  11/13/2015  CLINICAL DATA:  Cerebellar stroke. Vertigo. Left hand clumsiness for 2 days. Patient transferred from [facility] earlier today. EXAM: CT HEAD WITHOUT CONTRAST TECHNIQUE: Contiguous axial images were obtained from the base of the skull through the vertex without intravenous contrast. COMPARISON:  Head CT earlier this day at 943 hour at [facility]. FINDINGS: Decreased attenuation in the left cerebellum causing effacement of the fourth ventricle and mass effect on the midbrain, unchanged in degree and appearance from prior exam. No hemorrhagic transformation. There is tonsillar crowding unchanged. Overall size and extent is unchanged. No developing hydrocephalus. No evidence of supratentorial infarct or acute ischemia. Paranasal sinus disease with complete opacification of the frontal sinuses, left maxillary sinus, and majority of the ethmoid air cells. Mastoid air cells are well aerated. IMPRESSION: Unchanged appearance of the large left cerebellar infarct from exam 12 hours prior causing effacement of the fourth ventricle and mass effect on the midbrain. No hemorrhagic transformation. Electronically Signed   By: Jeb Levering M.D.   On: 11/13/2015 23:04   Mr Brain Wo Contrast  11/14/2015  CLINICAL DATA:  Dizziness and headache, follow-up cerebellar stroke. 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 History of the hypertension and diabetes. EXAM: MRI HEAD  WITHOUT CONTRAST TECHNIQUE: Multiplanar, multiecho pulse sequences of the brain and surrounding structures were obtained without intravenous contrast. COMPARISON:  CT head November 13, 2015 FINDINGS: Multiple sequences are  moderately motion degraded, axial T1 is severely motion degraded. INTRACRANIAL CONTENTS: Wedge-like area of reduced diffusion LEFT inferior cerebellar with corresponding low ADC values. T2 bright cytotoxic edema LEFT cerebellum and susceptibility artifact. Mild ventriculomegaly with effaced fourth ventricle. No convincing evidence of interstitial edema/ transependymal flow cerebral spinal fluid. Effaced pre pontine cistern with mild upward and downward herniation of the cerebellum. Patchy supratentorial white matter FLAIR T2 hyperintensities. No abnormal extra-axial fluid collections. No extra-axial masses though, contrast enhanced sequences would be more sensitive. Loss of LEFT vertebral artery flow void. ORBITS: The included ocular globes and orbital contents are non-suspicious. SINUSES: Moderate to severe paranasal sinusitis with LEFT maxillary sinus reduced diffusion concerning for fungal sinusitis. The mastoid air cells are well aerated. SKULL/SOFT TISSUES: Partially empty sella. No suspicious calvarial bone marrow signal. Craniocervical junction maintained. IMPRESSION: Motion degraded examination. 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 Mild ventriculomegaly, in part due to effacement of fourth ventricle. Mild cerebellar herniation and effaced pre pontine cistern. LEFT vertebral artery occlusion better seen on CTA from 1 day prior reported separately. Mild to moderate white matter changes compatible with chronic small vessel ischemic disease. Electronically Signed   By: Elon Alas M.D.   On: 11/14/2015 04:59       Medical Problem List and Plan: 1.  Dizziness, nausea vomiting with decreased functional mobility secondary to  left PICA infarct/petechial hemorrhagic transformation. 2.  DVT Prophylaxis/Anticoagulation: SCDs. Monitor for any signs of DVT  -mobilize 3. Pain Management: Tylenol as needed 4. Hypertension. Lisinopril 10 mg daily, Lopressor 25 mg twice a day. Permissive hypertension early on. 5. Neuropsych: This patient is capable of making decisions on his own behalf. 6. Skin/Wound Care: Routine skin checks 7. Fluids/Electrolytes/Nutrition: Routine I&O with follow-up chemistries  -encourage adequate PO 8. Uncontrolled diabetes mellitus. Hemoglobin A1c 12.6. Check blood sugars before meals and at bedtime. Diabetic teaching.Lantus 15 unit QHS 9. Tobacco abuse. Counseling    Post Admission Physician Evaluation: 1. Functional deficits secondary  to left PICA infarct with hermorrhage. 2. Patient is admitted to receive collaborative, interdisciplinary care between the physiatrist, rehab nursing staff, and therapy team. 3. Patient's level of medical complexity and substantial therapy needs in context of that medical necessity cannot be provided at a lesser intensity of care such as a SNF. 4. Patient has experienced substantial functional loss from his/her baseline which was documented above under the "Functional History" and "Functional Status" headings.  Judging by the patient's diagnosis, physical exam, and functional history, the patient has potential for functional progress which will result in measurable gains while on inpatient rehab.  These gains will be of substantial and practical use upon discharge  in facilitating mobility and self-care at the household level. 5. Physiatrist will provide 24 hour management of medical needs as well as oversight of the therapy plan/treatment and provide guidance as appropriate regarding the interaction of the two. 6. 24 hour rehab nursing will assist with bladder management, bowel management, safety, skin/wound care, disease management, medication administration, pain  management and patient education  and help integrate therapy concepts, techniques,education, etc. 7. PT will assess and treat for/with: Lower extremity strength, range of motion, stamina, balance, functional mobility, safety, adaptive techniques and equipment, NMR, vestibular assessment/rx, symptom mgt, ego support.   Goals are: mod I. 8. OT will assess and treat for/with: ADL's, functional mobility, safety, upper extremity strength, adaptive techniques and equipment, NMR, vestibular assessment/rx, symptom mgt, community reintegration.   Goals are: mod I. Therapy may proceed with  showering this patient. 9. SLP will assess and treat for/with: cognition,communication.  Goals are: mod I. 10. Case Management and Social Worker will assess and treat for psychological issues and discharge planning. 11. Team conference will be held weekly to assess progress toward goals and to determine barriers to discharge. 12. Patient will receive at least 3 hours of therapy per day at least 5 days per week. 13. ELOS: 7 days       14. Prognosis:  excellent     Meredith Staggers, MD, [place] [facility] 11/16/2015

## 2015-11-16 NOTE — Progress Notes (Signed)
Results for Alvina FilbertWILSON, Jelan (MRN [id number]) as of 11/16/2015 11:44  Ref. Range 11/15/2015 20:30 11/16/2015 00:30 11/16/2015 04:52 11/16/2015 07:52 11/16/2015 11:32  Glucose-Capillary Latest Ref Range: 65-99 mg/dL 413292 (H) 244213 (H) 010236 (H) 245 (H) 278 (H)  Noted that CBGs continue to be elevated.  Recommend adding Lantus 15 units daily and continuing Novolog MODERATE correction scale as ordered. Will continue to monitor blood sugars while in the hospital. Smith MinceKendra Jru Pense RN BSN CDE

## 2015-11-16 NOTE — Interval H&P Note (Signed)
Derrick Robinson was admitted today to Inpatient Rehabilitation with the diagnosis of left PICA infarct with hemorrhage.  The patient's history has been reviewed, patient examined, and there is no change in status.  Patient continues to be appropriate for intensive inpatient rehabilitation.  I have reviewed the patient's chart and labs.  Questions were answered to the patient's satisfaction. The PAPE has been reviewed and assessment remains appropriate.  Derrick Robinson T 11/16/2015, 5:04 PM

## 2015-11-16 NOTE — Plan of Care (Signed)
Problem: RH KNOWLEDGE DEFICIT Goal: RH STG INCREASE KNOWLEDGE OF DIABETES Pt will verbalized s/s of hypoglycemia/hyperglycemia

## 2015-11-16 NOTE — Care Management Note (Signed)
Case Management Note  Patient Details  Name: Derrick Robinson MRN: [id number] Date of Birth: [birth year]  Subjective/Objective:                    Action/Plan: Patient discharging to CIR today. No further needs per CM.   Expected Discharge Date:                  Expected Discharge Plan:     In-House Referral:     Discharge planning Services     Post Acute Care Choice:    Choice offered to:     DME Arranged:    DME Agency:     HH Arranged:    HH Agency:     Status of Service:     Medicare Important Message Given:    Date Medicare IM Given:    Medicare IM give by:    Date Additional Medicare IM Given:    Additional Medicare Important Message give by:     If discussed at Long Length of Stay Meetings, dates discussed:    Additional Comments:  Kermit BaloKelli F Joziah Dollins, RN 11/16/2015, 12:58 PM

## 2015-11-16 NOTE — Discharge Summary (Signed)
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  Past Medical History  Diagnosis Date  . Diabetes (HCC)   . HTN (hypertension)    Past Surgical History  Procedure Laterality Date  . Foot surgery      Medications to be continued on Rehab . [START ON 11/17/2015] aspirin EC  81 mg Oral Daily  . atorvastatin  40 mg Oral q1800  . clopidogrel  75 mg Oral Daily  . insulin aspart  0-15 Units Subcutaneous Q4H  . lisinopril  10 mg Oral Daily  . metoprolol tartrate  25 mg Oral BID    LABORATORY STUDIES CBC    Component Value Date/Time   WBC 15.5* 11/15/2015 0307   RBC 5.30 11/15/2015 0307   HGB 16.0 11/15/2015 0307   HCT 45.5 11/15/2015 0307   PLT 300 11/15/2015 0307   MCV 85.8 11/15/2015 0307   MCH 30.2 11/15/2015 0307   MCHC 35.2 11/15/2015 0307   RDW 12.1 11/15/2015 0307   CMP    Component Value Date/Time   NA 136 11/14/2015 0518   K 3.6 11/14/2015 0518   CL 99* 11/14/2015 0518   CO2 24 11/14/2015 0518   GLUCOSE 235* 11/14/2015 0518   BUN 18 11/14/2015 0518   CREATININE 0.71 11/14/2015 0518   CALCIUM 9.1 11/14/2015 0518   PROT 7.0 11/13/2015 2332   ALBUMIN 3.4* 11/13/2015 2332   AST 18 11/13/2015 2332   ALT 26 11/13/2015 2332   ALKPHOS 108 11/13/2015 2332   BILITOT  1.1 11/13/2015 2332   GFRNONAA >60 11/14/2015 0518   GFRAA >60 11/14/2015 0518   COAGS Lab Results  Component Value Date   INR 1.10 11/13/2015   Lipid Panel    Component Value Date/Time   CHOL 229* 11/14/2015 0518   TRIG 140 11/14/2015 0518   HDL 60 11/14/2015 0518   CHOLHDL 3.8 11/14/2015 0518   VLDL 28 11/14/2015 0518   LDLCALC 141* 11/14/2015 0518   HgbA1C  Lab Results  Component Value Date   HGBA1C 12.6* 11/14/2015   Urinalysis    Component Value Date/Time   COLORURINE STRAW* 11/14/2015 0125   APPEARANCEUR CLEAR 11/14/2015 0125   LABSPEC 1.027 11/14/2015 0125   PHURINE 5.5 11/14/2015 0125   GLUCOSEU >1000* 11/14/2015 0125   HGBUR NEGATIVE 11/14/2015 0125   BILIRUBINUR NEGATIVE 11/14/2015 0125   KETONESUR >80* 11/14/2015 0125   PROTEINUR NEGATIVE 11/14/2015 0125   NITRITE NEGATIVE 11/14/2015 0125   LEUKOCYTESUR NEGATIVE 11/14/2015 0125   Urine Drug Screen     Component Value Date/Time   LABOPIA POSITIVE* 11/14/2015 0125   COCAINSCRNUR NONE DETECTED 11/14/2015 0125   LABBENZ NONE DETECTED 11/14/2015 0125   AMPHETMU POSITIVE* 11/14/2015 0125   THCU NONE DETECTED 11/14/2015 0125   LABBARB NONE DETECTED  11/14/2015 0125     SIGNIFICANT DIAGNOSTIC STUDIES Ct Head Wo Contrast 11/13/2015 Unchanged appearance of the large left cerebellar infarct from exam 12 hours prior causing effacement of the fourth ventricle and mass effect on the midbrain. No hemorrhagic transformation.   Mr Brain Wo Contrast 11/14/2015 Motion degraded examination. 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 Mild ventriculomegaly, in part due to effacement of fourth ventricle. Mild cerebellar herniation and effaced pre pontine cistern. LEFT vertebral artery occlusion better seen on CTA from 1 day prior reported separately. Mild to moderate white matter changes compatible with chronic small vessel ischemic disease.   CTA head & neck Large  acute left PICA infarct.  Left vertebral artery occluded at the origin with faint reconstitution distally. Cannot exclude thrombus in the distal left vertebral artery. Left PICA is occluded compatible with acute infarct. Left vertebral occlusion could be due to atherosclerotic disease or dissection .   Carotid Doppler   Bilateral: 1-39% ICA stenosis. Vertebral artery flow is antegrade on the right. Left not insonated.  2D Echocardiogram  Left ventricle: The cavity size was normal. There was mild concentric hypertrophy. Systolic function was normal. The estimated ejection fraction was in the range of 60% to 65%. Wall motion was normal; there were no regional wall motion abnormalities.  Chest Xray pending      HISTORY OF PRESENT ILLNESS Derrick Robinson is a 45 y.o. male with history of HTN and DMII. He was at his mother's house on Saturday and had dinner there. In the middle of the night he started having severe vomiting and did this x 2. From the first time he vomited he became very wobbly and had vertigo. He decided not to go to the hospital. They thought maybe this was a stomach virus. He was not getting better by today and they decided to finally go to [facility] where a CT of the head showed a large left sided cerebellar stroke with some mass effect. He was transferred to [facility] around 10:30pm on 11/13/2015. The imaging was not available and after discussing the case with hospitalist who admitted him a STAT head CT was ordered in order to visualize the stroke better. This was read as a stable scan compared to prior. Dr. Cena Benton spoke with Dr. Jeral Fruit as well and asked for the patient to be trasnferred to the ICU for frequent neuro checks and moderate decrease of blood pressure given the dissection. Dr. Cena Benton discussed the case with pulmonary critical care attending and he will be assuming as attending on the case. Deferred the use of heparin given the large size of his stroke, as a bleed  into it could possibly turn into sudden death. He received a full dose of asa earlier today. Will order daily asa 325. Stroke attending to decide tomorrow on the use of heparin drip and possibly other imaging studies (MRA with fat suppresion or catheter angio if needed). Dr. Cena Benton discussed this with the patient and his wife at bedside. One complicating factor is that he received morphine valium and dilaudid for headache earlier. Both pt and wife denied any hx of head trauma. Patient was last known well Saturday 11/10/2015. Patient was not administered IV t-PA secondary to delay in arrival. He was admitted for further evaluation and treatment.    HOSPITAL COURSE Mr. Jayant Kriz is a 45 y.o. male with history of diabetes, hypertension and cigarette smoking presenting with sudden onset headache and nausea followed by vomiting, Vertigo and left hand  clumsiness. Transferred from [facility] to [facility] He did not receive IV t-PA due to delay in arrival.   Stroke: large left PICA infarct with petechial hemorrhagic transformation, cerebellar edema with cerebellar herniation and effacement of fourth ventricle. Infarct secondary to L VA occlusion from large vessel disease   Neurosurgery consulted [facility]) no immediate surgery  Resultant L sided ataxia, dysphagia, gait ataxia  CT large left cerebellar infarct with effacement of fourth ventricle and mass effect on the midbrain  MRI Evolving left pica territory infarct with petechial hemorrhage, effacement of fourth ventricle, mild cerebellar herniation and effaced prepontine cistern. Left vertebral artery occlusion. White matter changes. Small vessel disease.  CTA head and neck [facility]) Large acute L PICA infarct. L VA occluded at origin. L PICA occlusion.  Carotid Doppler LVA occlusion  2D Echo No source of embolus   LDL 141  HgbA1c 12.6  No antithrombotic prior to admission, initially placed on aspirin 325 mg. Changed to  aspirin and plavix at time of discharge given large vessel stenosis. Continue 3 months, then Plavix alone.  Patient counseled to be compliant with his antithrombotic medications   Consider for Stroke AF trial. [facility] research dept to follow up   Ongoing aggressive stroke risk factor management  Therapy recommendations: CIR  Disposition: CIR  Hypertensive emergency  On no BP meds prior to admission, diet controlled  Blood pressure as high as 192/107  Treated initially with cardene  Started on lisinopril 10, and metoprolol 25 mg twice a day in hospital  Blood pressure now mostly 150s  monitor  Hyperlipidemia  Home meds: No statin  LDL 141, goal < 70  Added Lipitor 40 mg daily at discharge plan  Diabetes type 2, uncontrolled  Not on home medications  HgbA1c 12.6, goal < 7.0  CBGs upper 200s with coverage  Lantus to be added on rehab  monitor  Other Stroke Risk Factors  Cigarette smoker, advised to stop smoking  ETOH use  Other Active Problems  Leukocytosis, 17.1-> 15.5. Afebrile. UA without infection. Check CXR   DISCHARGE EXAM Blood pressure 154/65, pulse 99, temperature 97.7 F (36.5 C), temperature source Oral, resp. rate 18, height  (1.803 m), weight 79.7 kg (175 lb 11.3 oz), SpO2 100 %. Pleasant middle-age Caucasian obese male not in distress. . Afebrile. Head is nontraumatic. Neck is supple without bruit. Cardiac exam no murmur or gallop. Lungs are clear to auscultation. Distal pulses are well felt. Neurological Exam :  Awake alert oriented 3 with normal speech and language function. No aphasia, dysarthria or apraxia.  Right pupil 4 mm and left 5 mm and both reactive. Fundi were not visualized. No Horner`s syndrome. Facial sensation is intact.Extraocular moments are full range with lateral gaze evoked nystagmus mostly on the left and minimum on right. Visual fields and acuity seems adequate. Face is symmetric without any weakness.  Tongue is midline. Cough and gag seem adequate. Motor system exam revealed symmetric upper and lower extremity Strength No focal weakness.Finger-to-nose and knee to heel coordination are accurate Deep tendon reflexes are 2+ symmetric. Plantars are downgoing. Sensation is intact and preserved bilaterally. Gait was not tested.   Discharge Diet  Diet Carb Modified Fluid consistency:: Thin; Room service appropriate?: Yes liquids  DISCHARGE PLAN  Disposition:  Transfer to [facility] for ongoing PT, OT and ST  aspirin 81 mg daily and clopidogrel 75 mg daily for secondary stroke prevention 3 months, then Plavix alone  Follow-up chest x-ray, CBGs, WBCs and blood pressure.  Recommend ongoing risk factor control by Primary Care Physician at time of discharge from inpatient rehabilitation.  Follow-up No PCP Per Patient in 2 weeks following discharge from rehab.  Follow-up with Dr. Delia Heady, Stroke Clinic in 2 months, office to schedule an appointment.   60 minutes were spent preparing discharge.  Rhoderick Moody [facility] See Amion for Pager information 11/16/2015 12:43 PM  I had a long d/w patient about his recent stroke, risk for recurrent stroke/TIAs, personally independently reviewed imaging studies and stroke evaluation results and answered questions.Continue aspirin 81 mg daily and clopidogrel 75 mg daily  for secondary stroke prevention x 3 months then aspirin alone and maintain strict control of hypertension with blood pressure goal below 130/90, diabetes with hemoglobin A1c goal below 6.5% and lipids with LDL cholesterol goal below 70 mg/dL. I also advised the patient to eat a healthy diet with plenty of whole grains, cereals, fruits and vegetables, exercise regularly and maintain ideal body weight Followup in the future with me in 2 months in stroke clinic.  Delia Heady, MD Medical Director [facility] St[facility] Center Pager: [phone] 11/16/2015 2:33  PM

## 2015-11-16 NOTE — Plan of Care (Signed)
Problem: RH SAFETY Goal: RH STG ADHERE TO SAFETY PRECAUTIONS W/ASSISTANCE/DEVICE STG Adhere to Safety Precautions With Assistance/Device. Supervision

## 2015-11-16 NOTE — Progress Notes (Signed)
Physical Therapy Treatment Patient Details Name: Derrick Robinson MRN: [id number] DOB: 05-02-1971 Today's Date: 11/16/2015    History of Present Illness 45 y.o. male [facility] for dizzines, HA, and vomiting.  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  Pt with significant PMHx of HTN and DM.     PT Comments    Mr. Derrick Robinson continues to demonstrate decreased awareness of his deficits, requiring up to mod assist w/ spontaneous LOB while ambulating.  Pt completed therapeutic exercises w/ focus on coordination as documented below.  Pt will benefit from continued skilled PT services to increase functional independence and safety.   Follow Up Recommendations  CIR     Equipment Recommendations  Rolling walker with 5" wheels    Recommendations for Other Services       Precautions / Restrictions Precautions Precautions: Fall Precaution Comments: significant balance deficits and poor safety awareness and awareness of deficits.  Restrictions Weight Bearing Restrictions: No    Mobility  Bed Mobility Overal bed mobility: Needs Assistance Bed Mobility: Supine to Sit;Sit to Supine     Supine to sit: Supervision     General bed mobility comments: supervision for safety   Transfers Overall transfer level: Needs assistance Equipment used: Rolling walker (2 wheeled) Transfers: Sit to/from Stand Sit to Stand: Min assist         General transfer comment: Min assist for balance when going to stand.  Lordotic posture and weight shifting back to his heels before steadying w/ assist.  Ambulation/Gait Ambulation/Gait assistance: Mod assist Ambulation Distance (Feet): 200 Feet Assistive device: Rolling walker (2 wheeled) Gait Pattern/deviations: Step-through pattern;Ataxic;Staggering  right;Staggering left;Narrow base of support;Scissoring Gait velocity: Cues to slow down when he begins to lose his balance Gait velocity interpretation: at or above normal speed for age/gender General Gait Details: Pt spontaneously staggering Lt or Rt, requring mod assist to steady.  Ataxic and scissoring gait.  Balance improved w/ cues for bracing his core and after weight shifting exercise as documented below.   Stairs            Wheelchair Mobility    Modified Rankin (Stroke Patients Only) Modified Rankin (Stroke Patients Only) Pre-Morbid Rankin Score: No symptoms Modified Rankin: Moderately severe disability     Balance Overall balance assessment: Needs assistance Sitting-balance support: Feet supported;No upper extremity supported Sitting balance-Leahy Scale: Fair     Standing balance support: No upper extremity supported;During functional activity Standing balance-Leahy Scale: Poor Standing balance comment: Min assist when pt adjusting his gown w/ both hands                    Cognition Arousal/Alertness: Awake/alert Behavior During Therapy: WFL for tasks assessed/performed Overall Cognitive Status: Impaired/Different from baseline Area of Impairment: Safety/judgement;Awareness         Safety/Judgement: Decreased awareness of safety Awareness: Emergent   General Comments: Requires verbal cues to slow down when he beings losing his balance while ambulating, appearing unaware of his deficits.    Exercises Other Exercises Other Exercises: Step forward w/ weight shift x 15 on each LE while activating core Other Exercises: LE coordination exercise tapping therapist's moving hand as target while sitting EOB    General Comments        Pertinent Vitals/Pain Pain Assessment: No/denies pain    Home Living  Prior Function            PT Goals (current goals can now be found in the care plan section) Acute Rehab PT  Goals Patient Stated Goal: to go home PT Goal Formulation: With patient/family Time For Goal Achievement: 11/28/15 Potential to Achieve Goals: Good Progress towards PT goals: Progressing toward goals    Frequency  Min 4X/week    PT Plan Current plan remains appropriate    Co-evaluation             End of Session Equipment Utilized During Treatment: Gait belt Activity Tolerance: Patient tolerated treatment well Patient left: with call bell/phone within reach;in bed;with bed alarm set     Time: 1020-1040 PT Time Calculation (min) (ACUTE ONLY): 20 min  Charges:  $Gait Training: 8-22 mins                    G Codes:      Derrick Robinson PT, DPT  Pager: [phone] Phone: [phone] 11/16/2015, 11:53 AM

## 2015-11-16 NOTE — Progress Notes (Signed)
Derrick OysterZachary T Swartz, MD Physician Signed Physical Medicine and Rehabilitation PMR Pre-admission 11/16/2015 1:11 PM  Related encounter: [facility] (Current) [facility]    Expand All Collapse All   PMR Admission Coordinator Pre-Admission Assessment  Patient: Derrick Robinson is an 45 y.o., male MRN: [id number] DOB: Jan 20, 1971 Height: 5\' 11"  (180.3 cm) Weight: 79.7 kg (175 lb 11.3 oz)  Insurance Information HMO: PPO: PCP: IPA: 80/20: OTHER:  PRIMARY: Uninsured  Self employed  Medicaid Application Date: Case Manager:  Disability Application Date: Case Worker:   Emergency Conservator, museum/galleryContact Information Contact Information    Name Relation Home Work Mobile   [place],[name] Significant other   [phone]   [place],Margaret Mother [phone]  [phone]   Dorthula NettlesWilson,Kendall Daughter   [phone]     Current Medical History   Patient Admitting Diagnosis: 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 right handed male with history of tobacco abuse, hypertension, diabetes mellitus. Patient on no scheduled medications at time of admission. Presented 11/13/2015 with persistent dizziness, bouts of nausea and vomiting. [facility] CT of the head showed a large left-sided cerebellar stroke with some mass effect. [facility]. 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 Mild ventriculomegaly with effacement of fourth ventricle. Left vertebral artery occlusion.. Intravenous Cardene  initiated for blood pressure control. Carotid Dopplers 1-39 percent bilateral ICA stenosis. Echocardiogram with ejection fraction 65% no wall motion abnormalities. Neurology consulted. Patient did not receive TPA. Currently maintained on aspirin for CVA prophylaxis. Hemoglobin A1c of 12.6 with insulin therapy initiated. Tolerating a regular diet.  Total: 3 NIH    Past Medical History  Past Medical History  Diagnosis Date  . Diabetes (HCC)   . HTN (hypertension)    Patient states he was aware he had DM, had lost 50 lbs and felt it was under control. New dx of HTN this admit per pt.  Family History  family history is not on file.  Prior Rehab/Hospitalizations:  Has the patient had major surgery during 100 days prior to admission? No  Current Medications   Current facility-administered medications:  . 0.9 % sodium chloride infusion, , Intravenous, Continuous, Rahul P Desai, PA-C, Stopped at 11/15/15 1000 . acetaminophen (TYLENOL) tablet 650 mg, 650 mg, Oral, Q4H PRN **OR** acetaminophen (TYLENOL) suppository 650 mg, 650 mg, Rectal, Q4H PRN, Haydee SalterPhillip M Hobbs, MD . Melene Muller[START ON 11/17/2015] aspirin EC tablet 81 mg, 81 mg, Oral, Daily, Layne BentonSharon L Biby, NP . atorvastatin (LIPITOR) tablet 40 mg, 40 mg, Oral, q1800, Layne BentonSharon L Biby, NP . clopidogrel (PLAVIX) tablet 75 mg, 75 mg, Oral, Daily, Layne BentonSharon L Biby, NP . hydrALAZINE (APRESOLINE) injection 10 mg, 10 mg, Intravenous, Q6H PRN, Haydee SalterPhillip M Hobbs, MD, 10 mg at 11/14/15 2205 . insulin aspart (novoLOG) injection 0-15 Units, 0-15 Units, Subcutaneous, Q4H, Haydee SalterPhillip M Hobbs, MD, 8 Units at 11/16/15 1208 . labetalol (NORMODYNE,TRANDATE) injection 10-40 mg, 10-40 mg, Intravenous, Q2H PRN, Heron SabinsJose Vega, MD, 10 mg at 11/15/15 0220 . lisinopril (PRINIVIL,ZESTRIL) tablet 10 mg, 10 mg, Oral, Daily, Heron SabinsJose Vega, MD, 10 mg at 11/16/15 1027 . metoprolol tartrate (LOPRESSOR) tablet 25 mg, 25 mg, Oral, BID, Micki RileyPramod S Sethi, MD, 25 mg at 11/16/15 1027 .  senna-docusate (Senokot-S) tablet 1 tablet, 1 tablet, Oral, QHS PRN, Haydee SalterPhillip M Hobbs, MD  Patients Current  Diet: Diet Carb Modified Fluid consistency:: Thin; Room service appropriate?: Yes  Precautions / Restrictions Precautions Precautions: Fall Precaution Comments: significant balance deficits and poor safety awareness and awareness of deficits.  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  Home Assistive Devices / Equipment Home Assistive Devices/Equipment: Eyeglasses Home Equipment: Walker - 2 wheels, Bedside commode, Shower seat (family's equipment)  Prior Device Use: Indicate devices/aids used by the patient prior to current illness, exacerbation or injury? None of the above  Prior Functional Level Prior Function Level of Independence: Independent Comments: works as a Advice worker Care: Did the patient need help bathing, dressing, using the toilet or eating? Independent  Indoor Mobility: Did the patient need assistance with walking from room to room (with or without device)? Independent  Stairs: Did the patient need assistance with internal or external stairs (with or without device)? Independent  Functional Cognition: Did the patient need help planning regular tasks such as shopping or remembering to take medications? Independent  Current Functional Level Cognition  Arousal/Alertness: Awake/alert Overall Cognitive Status: Impaired/Different from baseline Orientation Level: Oriented X4 Safety/Judgement: Decreased awareness of safety General Comments: Requires verbal cues to slow down when he beings losing his balance while ambulating, appearing unaware of his  deficits. Attention: Sustained, Focused Focused Attention: Appears intact Sustained Attention: Appears intact Memory: Appears intact Awareness: Appears intact Problem Solving: Appears intact Safety/Judgment: Appears intact   Extremity Assessment (includes Sensation/Coordination)  Upper Extremity Assessment: RUE deficits/detail, LUE deficits/detail RUE Deficits / Details: mild dysmetria  RUE Coordination: decreased fine motor, decreased gross motor LUE Deficits / Details: mild dysmetria  LUE Coordination: decreased fine motor, decreased gross motor  Lower Extremity Assessment: Defer to PT evaluation RLE Deficits / Details: strength 5/5/ throughout  LLE Deficits / Details: strength 5/5 throughout    ADLs  Overall ADL's : Needs assistance/impaired Eating/Feeding: Modified independent Grooming: Wash/dry hands, Wash/dry face, Oral care, Brushing hair, Set up, Supervision/safety, Sitting Upper Body Bathing: Supervision/ safety, Set up, Sitting Lower Body Bathing: Moderate assistance, Sit to/from stand Upper Body Dressing : Set up, Supervision/safety, Sitting Lower Body Dressing: Moderate assistance, Sit to/from stand Lower Body Dressing Details (indicate cue type and reason): Pt requires assist for balance in standing  Toilet Transfer: Minimal assistance, Moderate assistance, Ambulation, Comfort height toilet, Regular Toilet Toilet Transfer Details (indicate cue type and reason): Pt with LOB requring mod A to recover  Toileting- Clothing Manipulation and Hygiene: Moderate assistance, Sit to/from stand Functional mobility during ADLs: Minimal assistance, Moderate assistance General ADL Comments: pt requires assist due to impaired balance     Mobility  Overal bed mobility: Needs Assistance Bed Mobility: Supine to Sit, Sit to Supine Supine to sit: Supervision Sit to supine: Supervision General bed mobility comments: supervision for safety     Transfers  Overall  transfer level: Needs assistance Equipment used: Rolling walker (2 wheeled) Transfers: Sit to/from Stand Sit to Stand: Min assist Stand pivot transfers: Min assist General transfer comment: Min assist for balance when going to stand. Lordotic posture and weight shifting back to his heels before steadying w/ assist.    Ambulation / Gait / Stairs / Wheelchair Mobility  Ambulation/Gait Ambulation/Gait assistance: Mod assist Ambulation Distance (Feet): 200 Feet  Assistive device: Rolling walker (2 wheeled) Gait Pattern/deviations: Step-through pattern, Ataxic, Staggering right, Staggering left, Narrow base of support, Scissoring General Gait Details: Pt spontaneously staggering Lt or Rt, requring mod assist to steady. Ataxic and scissoring gait. Balance improved w/ cues for bracing his core and after weight shifting exercise as documented below. Gait velocity: Cues to slow down when he begins to lose his balance Gait velocity interpretation: at or above normal speed for age/gender    Posture / Balance Balance Overall balance assessment: Needs assistance Sitting-balance support: Feet supported, No upper extremity supported Sitting balance-Leahy Scale: Fair Standing balance support: No upper extremity supported, During functional activity Standing balance-Leahy Scale: Poor Standing balance comment: Min assist when pt adjusting his gown w/ both hands    Special needs/care consideration Bowel mgmt: continent Bladder mgmt: continent Diabetic mgmt Hgb A1c 12.6. Pt was aware he had DM pta but has lost 50 lbs and thought it was controlled. Has no PCP and no CBG machine so was not monitoring his CBGS. New dx of HTN this admission Bed alarm for decreased safety awareness   Previous Home Environment Living Arrangements: Spouse/significant other (GF, Buyer, retail) Lives With: Significant other Available Help at Discharge: Available PRN/intermittently Type of Home: House Home Layout: One  level Home Access: Stairs to enter Entrance Stairs-Rails: None Entrance Stairs-Number of Steps: 3 Bathroom Shower/Tub: Associate Professor: Yes How Accessible: Accessible via walker Home Care Services: No  Discharge Living Setting Plans for Discharge Living Setting: Patient's home, Lives with (comment) (GF, [name]) Type of Home at Discharge: House Discharge Home Layout: One level Discharge Home Access: Stairs to enter Entrance Stairs-Rails: None Entrance Stairs-Number of Steps: 3 Discharge Bathroom Shower/Tub: Walk-in shower Discharge Bathroom Toilet: Standard Discharge Bathroom Accessibility: Yes How Accessible: Accessible via walker Does the patient have any problems obtaining your medications?: Yes (Describe) (uninsured, yes. NO PCP either)  Social/Family/Support Systems Patient Roles: Partner, Parent (1 dtr 17 yo, 3 boys teenagers) Contact Information: Buyer, retail, and Mom Anticipated Caregiver: [name] and Mom Anticipated Industrial/product designer Information: see above Ability/Limitations of Caregiver: [name] works, Mom is caregiver for her husband, Museum/gallery conservator Mom can assist Caregiver Availability: (close to 24/7 if needed) Discharge Plan Discussed with Primary Caregiver: Yes Is Caregiver In Agreement with Plan?: Yes Does Caregiver/Family have Issues with Lodging/Transportation while Pt is in Rehab?: No  Goals/Additional Needs Patient/Family Goal for Rehab: Mod I to supervision with PT, OT, and SLP Expected length of stay: ELOS 7-10 days Special Service Needs: Pt has no PCP and is uninsured Pt/Family Agrees to Admission and willing to participate: Yes Program Orientation Provided & Reviewed with Pt/Caregiver Including Roles & Responsibilities: Yes  Decrease burden of Care through IP rehab admission: n/a  Possible need for SNF placement upon discharge:not anticipated  Patient Condition: This patient's condition remains as documented in the  consult dated 11/15/2015, in which the Rehabilitation Physician determined and documented that the patient's condition is appropriate for intensive rehabilitative care in an inpatient rehabilitation facility. Will admit to inpatient rehab today.  Preadmission Screen Completed By: Clois Dupes, 11/16/2015 1:19 PM ______________________________________________________________________  Discussed status with Dr. Riley Kill on 11/16/2015 at 1319 and received telephone approval for admission today.  Admission Coordinator: Clois Dupes, time 1610 Date 11/16/2015          Revision History     Date/Time User Provider Type Action   11/16/2015 1:48 PM Derrick Oyster, MD Physician Sign   11/16/2015 1:20 PM Standley Brooking, RN Rehab Admission  Coordinator Sign   View Details Report

## 2015-11-16 NOTE — Progress Notes (Signed)
Patient is discharged from room 5C18 and transferred to unit 67M at this time. Alert and in stable condition. IV site d/c'd as well as tele. Transported via bed with all belongings at side.

## 2015-11-17 ENCOUNTER — Inpatient Hospital Stay (HOSPITAL_COMMUNITY): Payer: Self-pay | Admitting: Occupational Therapy

## 2015-11-17 ENCOUNTER — Inpatient Hospital Stay (HOSPITAL_COMMUNITY): Payer: Self-pay | Admitting: Speech Pathology

## 2015-11-17 ENCOUNTER — Inpatient Hospital Stay (HOSPITAL_COMMUNITY): Payer: Self-pay | Admitting: Physical Therapy

## 2015-11-17 DIAGNOSIS — I63532 Cerebral infarction due to unspecified occlusion or stenosis of left posterior cerebral artery: Secondary | ICD-10-CM

## 2015-11-17 DIAGNOSIS — I10 Essential (primary) hypertension: Secondary | ICD-10-CM

## 2015-11-17 DIAGNOSIS — K5901 Slow transit constipation: Secondary | ICD-10-CM

## 2015-11-17 LAB — GLUCOSE, CAPILLARY
GLUCOSE-CAPILLARY: 170 mg/dL — AB (ref 65–99)
GLUCOSE-CAPILLARY: 279 mg/dL — AB (ref 65–99)
GLUCOSE-CAPILLARY: 370 mg/dL — AB (ref 65–99)
Glucose-Capillary: 240 mg/dL — ABNORMAL HIGH (ref 65–99)
Glucose-Capillary: 248 mg/dL — ABNORMAL HIGH (ref 65–99)
Glucose-Capillary: 346 mg/dL — ABNORMAL HIGH (ref 65–99)

## 2015-11-17 MED ORDER — INSULIN GLARGINE 100 UNIT/ML ~~LOC~~ SOLN
17.0000 [IU] | Freq: Every day | SUBCUTANEOUS | Status: DC
  Administered 2015-11-17 – 2015-11-18 (×2): 17 [IU] via SUBCUTANEOUS
  Filled 2015-11-17 (×2): qty 0.17

## 2015-11-17 MED ORDER — LINACLOTIDE 145 MCG PO CAPS
145.0000 ug | ORAL_CAPSULE | Freq: Every day | ORAL | Status: DC
  Administered 2015-11-18 – 2015-11-19 (×2): 145 ug via ORAL
  Filled 2015-11-17 (×2): qty 1

## 2015-11-17 NOTE — Progress Notes (Addendum)
Derrick Robinson is a 45 y.o. male 06-Dec-1970 [id number]  Subjective: C/o constipation. No new problems. Slept well. Feeling OK.  Objective: Vital signs in last 24 hours: Temp:  [97.8 F (36.6 C)-98.3 F (36.8 C)] 97.8 F (36.6 C) (04/22 0447) Pulse Rate:  [69-80] 80 (04/22 0447) Resp:  [18] 18 (04/22 0447) BP: (149-166)/(76-83) 166/83 mmHg (04/22 0447) SpO2:  [100 %] 100 % (04/22 0447) Weight change:  Last BM Date: 11/14/15  Intake/Output from previous day: 04/21 0701 - 04/22 0700 In: 480 [P.O.:480] Out: -  Last cbgs: CBG (last 3)   Recent Labs  11/17/15 0007 11/17/15 0404 11/17/15 0837  GLUCAP 240* 170* 279*     Physical Exam General: No apparent distress   HEENT: not dry Lungs: Normal effort. Lungs clear to auscultation, no crackles or wheezes. Cardiovascular: Regular rate and rhythm, no edema Abdomen: S/NT/ND; BS(+) Musculoskeletal:  unchanged Neurological: No new neurological deficits Wounds: N/A    Skin: clear  Mental state: Alert, oriented, cooperative    Lab Results: BMET    Component Value Date/Time   NA 136 11/14/2015 0518   K 3.6 11/14/2015 0518   CL 99* 11/14/2015 0518   CO2 24 11/14/2015 0518   GLUCOSE 235* 11/14/2015 0518   BUN 18 11/14/2015 0518   CREATININE 0.71 11/14/2015 0518   CALCIUM 9.1 11/14/2015 0518   GFRNONAA >60 11/14/2015 0518   GFRAA >60 11/14/2015 0518   CBC    Component Value Date/Time   WBC 15.5* 11/15/2015 0307   RBC 5.30 11/15/2015 0307   HGB 16.0 11/15/2015 0307   HCT 45.5 11/15/2015 0307   PLT 300 11/15/2015 0307   MCV 85.8 11/15/2015 0307   MCH 30.2 11/15/2015 0307   MCHC 35.2 11/15/2015 0307   RDW 12.1 11/15/2015 0307    Studies/Results: Dg Chest 2 View  11/16/2015  CLINICAL DATA:  Leukocytosis.  Syncope. EXAM: CHEST  2 VIEW COMPARISON:  11/13/2015 chest radiograph. FINDINGS: Stable cardiomediastinal silhouette with normal heart size. No pneumothorax. No pleural effusion. Lungs appear clear, with no  acute consolidative airspace disease and no pulmonary edema. IMPRESSION: No active cardiopulmonary disease. Electronically Signed   By: Delbert PhenixJason A Poff M.D.   On: 11/16/2015 15:14    Medications: I have reviewed the patient's current medications.  Assessment/Plan:  1. Dizziness, nausea vomiting with decreased functional mobility secondary to left PICA infarct/petechial hemorrhagic transformation. 2. DVT Prophylaxis/Anticoagulation: SCDs. Monitor for any signs of DVT -mobilize 3. Pain Management: Tylenol as needed 4. Hypertension. Lisinopril 10 mg daily, Lopressor 25 mg twice a day. Permissive hypertension early on. 5. Neuropsych: This patient is capable of making decisions on his own behalf. 6. Skin/Wound Care: Routine skin checks 7. Fluids/Electrolytes/Nutrition: Routine I&O with follow-up chemistries -encourage adequate PO 8. Uncontrolled diabetes mellitus. Hemoglobin A1c 12.6. Check blood sugars before meals and at bedtime. Diabetic teaching.Lantus 15 unit QHS 9. Tobacco abuse. Counseling 10. Constipation. Linzess    Length of stay, days: 1  Sonda PrimesAlex Plotnikov , MD 11/17/2015, 11:49 AM
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
## 2015-11-18 ENCOUNTER — Inpatient Hospital Stay (HOSPITAL_COMMUNITY): Payer: Self-pay | Admitting: Physical Therapy

## 2015-11-18 DIAGNOSIS — K5909 Other constipation: Secondary | ICD-10-CM

## 2015-11-18 DIAGNOSIS — Z794 Long term (current) use of insulin: Secondary | ICD-10-CM

## 2015-11-18 DIAGNOSIS — E1159 Type 2 diabetes mellitus with other circulatory complications: Secondary | ICD-10-CM

## 2015-11-18 DIAGNOSIS — I63539 Cerebral infarction due to unspecified occlusion or stenosis of unspecified posterior cerebral artery: Secondary | ICD-10-CM

## 2015-11-18 LAB — GLUCOSE, CAPILLARY
GLUCOSE-CAPILLARY: 185 mg/dL — AB (ref 65–99)
GLUCOSE-CAPILLARY: 204 mg/dL — AB (ref 65–99)
Glucose-Capillary: 207 mg/dL — ABNORMAL HIGH (ref 65–99)
Glucose-Capillary: 242 mg/dL — ABNORMAL HIGH (ref 65–99)
Glucose-Capillary: 263 mg/dL — ABNORMAL HIGH (ref 65–99)
Glucose-Capillary: 348 mg/dL — ABNORMAL HIGH (ref 65–99)
Glucose-Capillary: 370 mg/dL — ABNORMAL HIGH (ref 65–99)

## 2015-11-18 NOTE — Progress Notes (Signed)
Derrick Robinson is a 45 y.o. male 12/02/1970 [id number]  Subjective: F/u constipation. No new problems.   Objective: Vital signs in last 24 hours: Temp:  [98.2 F (36.8 C)-98.3 F (36.8 C)] 98.2 F (36.8 C) (04/23 0408) Pulse Rate:  [86-98] 86 (04/23 0408) Resp:  [17-18] 17 (04/23 0408) BP: (131-161)/(70-95) 161/95 mmHg (04/23 0408) SpO2:  [95 %-100 %] 100 % (04/23 0408) Weight:  [181 lb (82.101 kg)] 181 lb (82.101 kg) (04/22 1300) Weight change:  Last BM Date: 11/14/15 (refused LOC)  Intake/Output from previous day: 04/22 0701 - 04/23 0700 In: 1560 [P.O.:1560] Out: -  Last cbgs: CBG (last 3)   Recent Labs  11/18/15 11/18/15 0405 11/18/15 0723  GLUCAP 207* 185* 204*     Physical Exam General: NAD asleep HEENT: not dry Lungs: Normal effort. Lungs clear to auscultation, no crackles or wheezes. Cardiovascular: Regular rate and rhythm, no edema Abdomen: S/NT/ND; BS(+) Musculoskeletal:  unchanged Neurological: No new neurological deficits Wounds: N/A    Skin: clear   Mental state: Asleep    Lab Results: BMET    Component Value Date/Time   NA 136 11/14/2015 0518   K 3.6 11/14/2015 0518   CL 99* 11/14/2015 0518   CO2 24 11/14/2015 0518   GLUCOSE 235* 11/14/2015 0518   BUN 18 11/14/2015 0518   CREATININE 0.71 11/14/2015 0518   CALCIUM 9.1 11/14/2015 0518   GFRNONAA >60 11/14/2015 0518   GFRAA >60 11/14/2015 0518   CBC    Component Value Date/Time   WBC 15.5* 11/15/2015 0307   RBC 5.30 11/15/2015 0307   HGB 16.0 11/15/2015 0307   HCT 45.5 11/15/2015 0307   PLT 300 11/15/2015 0307   MCV 85.8 11/15/2015 0307   MCH 30.2 11/15/2015 0307   MCHC 35.2 11/15/2015 0307   RDW 12.1 11/15/2015 0307    Studies/Results: Dg Chest 2 View  11/16/2015  CLINICAL DATA:  Leukocytosis.  Syncope. EXAM: CHEST  2 VIEW COMPARISON:  11/13/2015 chest radiograph. FINDINGS: Stable cardiomediastinal silhouette with normal heart size. No pneumothorax. No pleural effusion. Lungs  appear clear, with no acute consolidative airspace disease and no pulmonary edema. IMPRESSION: No active cardiopulmonary disease. Electronically Signed   By: Delbert PhenixJason A Poff M.D.   On: 11/16/2015 15:14    Medications: I have reviewed the patient's current medications.  Assessment/Plan:  1. Dizziness, nausea vomiting with decreased functional mobility secondary to left PICA infarct/petechial hemorrhagic transformation. 2. DVT Prophylaxis/Anticoagulation: SCDs. Monitor for any signs of DVT -mobilize 3. Pain Management: Tylenol as needed 4. Hypertension. Lisinopril 10 mg daily, Lopressor 25 mg twice a day. Permissive hypertension early on. 5. Neuropsych: This patient is capable of making decisions on his own behalf. 6. Skin/Wound Care: Routine skin checks 7. Fluids/Electrolytes/Nutrition: Routine I&O with follow-up chemistries -encourage adequate PO 8. Uncontrolled diabetes mellitus. Hemoglobin A1c 12.6. Check blood sugars before meals and at bedtime. Diabetic teaching.Lantus 15 unit QHS 9. Tobacco abuse. Counseling 10. Constipation. Linzess - better    Length of stay, days: 2  [name] , MD 11/18/2015, 8:57 AM

## 2015-11-18 NOTE — Progress Notes (Signed)
Hiccups off and on during night. LBM 04/19, declines PRN LOC. Patient reports, he was not checking blood sugars or taking insulin PTA. Not receptive to education about insulin. Non-compliant with diet. Hands on assist when ambulating, loses balance. Alfredo MartinezMurray, [place] A

## 2015-11-18 NOTE — Progress Notes (Signed)
Physical Therapy Session Note  Patient Details  Name: Derrick FilbertBradley Robinson MRN: [id number] Date of Birth: 07-28-1971  Today's Date: 11/18/2015 PT Individual Time: 1500-1530 PT Individual Time Calculation (min): 30 min  Pt missed 30 min treatment time 2/2 pt unwilling to participate/pt refusal  Skilled Therapeutic Interventions/Progress Updates:    Pt received in bed & agreeable to PT, denying c/o pain. Pt transferred supine>sit with Mod I with bed flat. Pt able to ambulate room>gym ~140 ft with RW & steady A with intermittent Mod A 2/2 ataxic gait & LOB. In gym pt negotiated 4 steps x 3 consecutive trials with B railings & steady A. Pt with poor eccentric control RLE when descending stairs. Pt noted he does not see purpose in practicing stairs as he only has stairs to get into house & none in the house. PT educated pt on need to practice stairs to increase his safety & for BLE strengthening to allow him to negotiate stairs into house. Pt reports he has B railings installed at stairs at home & also states that someone can take a picture of them for therapist to allow therapist to better simulate home environment. Addressed coordination by having pt stand and utilize RLE to tap specific numbers on step. Pt encouraged to not have BUE support but pt attempted to hold on with LUE. Pt reported he did not feel comfortable without holding on & noted dizziness & "I feel sick" after initiating task. Pt's BP = 1326/86 mmHg, HR = 91 bpm. Pt reported need to use restroom then ambulated 140 ft back to bathroom in room. Pt completed toilet transfer with supervision & instructed to use call bell when finished. PT exited room to notify RN of pt's c/o not feeling well & when PT returned pt was ambulating out of bathroom with assistance from girlfriend. PT educated both pt & girlfriend on need for family/friends to be checked off by therapist before completing transfers & ambulation & need to continue to call & wait on staff for  assistance as pt is unsteady when ambulating; girlfriend agreeable. Pt abruptly transferred to supine in bed & refused to participate in further therapy. PT thoroughly educated pt on benefits of PT to help address coordination & strength & to improve overall safe functional mobility. Pt voiced understanding but still refused to participate, reporting he did not feel well. Pt left in bed with all needs within reach.  Attempted to see pt later in day, at 1605 pm, & pt refused even with education & motivation from therapist.   Therapy Documentation Precautions:  Precautions Precautions: Fall Precaution Comments: significant balance deficits and poor safety awareness and awareness of deficits.  Restrictions Weight Bearing Restrictions: No  Pain: Pain Assessment Pain Assessment: No/denies pain   See Function Navigator for Current Functional Status.   Therapy/Group: Individual Therapy  Sandi MariscalVictoria M Macyn Remmert 11/18/2015, 5:29 PM

## 2015-11-19 ENCOUNTER — Inpatient Hospital Stay (HOSPITAL_COMMUNITY): Payer: Self-pay | Admitting: Physical Therapy

## 2015-11-19 ENCOUNTER — Inpatient Hospital Stay (HOSPITAL_COMMUNITY): Payer: Self-pay | Admitting: Occupational Therapy

## 2015-11-19 ENCOUNTER — Inpatient Hospital Stay (HOSPITAL_COMMUNITY): Payer: Self-pay

## 2015-11-19 LAB — COMPREHENSIVE METABOLIC PANEL
ALBUMIN: 3.6 g/dL (ref 3.5–5.0)
ALK PHOS: 101 U/L (ref 38–126)
ALT: 39 U/L (ref 17–63)
ANION GAP: 14 (ref 5–15)
AST: 26 U/L (ref 15–41)
BILIRUBIN TOTAL: 1.3 mg/dL — AB (ref 0.3–1.2)
BUN: 17 mg/dL (ref 6–20)
CALCIUM: 9.7 mg/dL (ref 8.9–10.3)
CO2: 25 mmol/L (ref 22–32)
Chloride: 97 mmol/L — ABNORMAL LOW (ref 101–111)
Creatinine, Ser: 0.69 mg/dL (ref 0.61–1.24)
GFR calc non Af Amer: >60 mL/min (ref >60)
GLUCOSE: 169 mg/dL — AB (ref 65–99)
Potassium: 4.2 mmol/L (ref 3.5–5.1)
Sodium: 136 mmol/L (ref 135–145)
TOTAL PROTEIN: 7.2 g/dL (ref 6.5–8.1)

## 2015-11-19 LAB — CBC WITH DIFFERENTIAL/PLATELET
Basophils Absolute: 0.1 10*3/uL (ref 0.0–0.1)
Basophils Relative: 1 %
Eosinophils Absolute: 0.2 10*3/uL (ref 0.0–0.7)
Eosinophils Relative: 1 %
HCT: 50.4 % (ref 39.0–52.0)
HEMOGLOBIN: 18 g/dL — AB (ref 13.0–17.0)
LYMPHS ABS: 4 10*3/uL (ref 0.7–4.0)
LYMPHS PCT: 31 %
MCH: 30.8 pg (ref 26.0–34.0)
MCHC: 35.7 g/dL (ref 30.0–36.0)
MCV: 86.2 fL (ref 78.0–100.0)
MONOS PCT: 7 %
Monocytes Absolute: 0.9 10*3/uL (ref 0.1–1.0)
NEUTROS ABS: 7.9 10*3/uL — AB (ref 1.7–7.7)
NEUTROS PCT: 60 %
Platelets: 288 10*3/uL (ref 150–400)
RBC: 5.85 MIL/uL — ABNORMAL HIGH (ref 4.22–5.81)
RDW: 12.4 % (ref 11.5–15.5)
WBC: 12.9 10*3/uL — ABNORMAL HIGH (ref 4.0–10.5)

## 2015-11-19 LAB — GLUCOSE, CAPILLARY
GLUCOSE-CAPILLARY: 238 mg/dL — AB (ref 65–99)
Glucose-Capillary: 174 mg/dL — ABNORMAL HIGH (ref 65–99)
Glucose-Capillary: 333 mg/dL — ABNORMAL HIGH (ref 65–99)
Glucose-Capillary: 259 mg/dL — ABNORMAL HIGH (ref 65–99)
Glucose-Capillary: 309 mg/dL — ABNORMAL HIGH (ref 65–99)

## 2015-11-19 MED ORDER — CLOPIDOGREL BISULFATE 75 MG PO TABS
75.0000 mg | ORAL_TABLET | Freq: Every day | ORAL | Status: DC
  Administered 2015-11-20: 75 mg via ORAL
  Filled 2015-11-19: qty 1

## 2015-11-19 MED ORDER — INSULIN ASPART 100 UNIT/ML ~~LOC~~ SOLN
0.0000 [IU] | Freq: Every day | SUBCUTANEOUS | Status: DC
  Administered 2015-11-19: 4 [IU] via SUBCUTANEOUS

## 2015-11-19 MED ORDER — ASPIRIN 81 MG PO CHEW
81.0000 mg | CHEWABLE_TABLET | Freq: Every day | ORAL | Status: DC
  Administered 2015-11-20: 81 mg via ORAL
  Filled 2015-11-19: qty 1

## 2015-11-19 MED ORDER — METFORMIN HCL 500 MG PO TABS
500.0000 mg | ORAL_TABLET | Freq: Two times a day (BID) | ORAL | Status: DC
  Administered 2015-11-19 (×2): 500 mg via ORAL
  Filled 2015-11-19 (×2): qty 1

## 2015-11-19 MED ORDER — INSULIN ASPART 100 UNIT/ML ~~LOC~~ SOLN
0.0000 [IU] | Freq: Three times a day (TID) | SUBCUTANEOUS | Status: DC
  Administered 2015-11-19: 8 [IU] via SUBCUTANEOUS
  Administered 2015-11-19: 5 [IU] via SUBCUTANEOUS
  Administered 2015-11-20: 8 [IU] via SUBCUTANEOUS

## 2015-11-19 MED ORDER — INSULIN GLARGINE 100 UNIT/ML ~~LOC~~ SOLN
20.0000 [IU] | Freq: Every day | SUBCUTANEOUS | Status: DC
  Administered 2015-11-19: 20 [IU] via SUBCUTANEOUS
  Filled 2015-11-19 (×2): qty 0.2

## 2015-11-19 NOTE — Progress Notes (Signed)
Social Work  Social Work Assessment and Plan  Patient Details  Name: Derrick Robinson MRN: [id number] Date of Birth: 30-Jan-1971  Today's Date: 11/19/2015  Problem List:  Patient Active Problem List   Diagnosis Date Noted  . Cerebral edema (HCC) 11/16/2015  . Hypertensive emergency 11/16/2015  . Cigarette smoker 11/16/2015  . Leukocytosis 11/16/2015  . 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 Diabetes (HCC) 11/14/2015  . HTN (hypertension) 11/14/2015  . Tobacco abuse 11/14/2015  . 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 Diabetes (HCC)   . HTN (hypertension)    Past Surgical History:  Past Surgical History  Procedure Laterality Date  . Foot surgery     Social History:  reports that he has been smoking.  He does not have any smokeless tobacco history on file. He reports that he drinks alcohol. His drug history is not on file.  Family / Support Systems Marital Status: Divorced Patient Roles: Programme researcher, broadcasting/film/video, Parent, Other (Comment) (employee) Spouse/Significant Other: Derrick Robinson-girlfriend   [phone] Children: 45 yo daughter and three teenage son's Other Supports:  Mom-Derrick Robinson [phone]-home  [phone]-cell Anticipated Caregiver: GF and Mom Ability/Limitations of Caregiver: Derrick Robinson works Acupuncturist is a caregiver for her husband Medical laboratory scientific officer: Other (Comment) (trying to come up with a plan) Family Dynamics: Close knit family Derrick Robinson has three teenage daughter's and pt has four children, all are involved and supportive. Pt's Mom and Derrick's Mom are involved and willing to assist. Pt's father passed away three weeks ago, still greiving over this.  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   Abuse/Neglect Physical Abuse: Denies Verbal Abuse: Denies Sexual Abuse: Denies Exploitation of patient/patient's resources: Denies Self-Neglect: Denies  Emotional Status Pt's affect, behavior adn adjustment status: Pt feels he can go home and wants to go home today. Derrick Robinson will not let him and has gone through education to learn his insulin and BS checks. Pt has always done what he wanted and plans to again when he is discharged from here. Derrick knows him well and is working on him. Recent Psychosocial Issues: health issues he was not seeing a MD and has diabetes Pyschiatric History: No history recently lost his father three weeks ago. Would benefit from seeing neuro-psych if here long enough. Substance Abuse History: Tobacco-aware of the risks of smoking and not sure what he plans to do. He reports no other drug issues, he was positive could not answer why  Patient / Family Perceptions, Expectations & Goals Pt/Family understanding of illness & functional limitations: Pt and Derrick can explain his stroke, pt lacks awareness of his deficits, but Derrick Robinson is aware and realizes he will be a safety risk at home if he goes home in next two days. They do talk with the MD and PA and are aware of his treatment plan. If only pt bought into it. Premorbid pt/family roles/activities: Music therapist, father, son, boyfriend, friend, etc Anticipated changes in roles/activities/participation: resume Pt/family expectations/goals: Pt states: " I need to get out of  here and go home."  Derrick states: " I want him to stay and get what he needs here, stubborn man."  [employer] Community Agencies: None Premorbid  Home Care/DME Agencies: None Transportation available at discharge: [employer] Resource referrals recommended: Neuropsychology, Support group (specify)  Discharge Planning Living Arrangements: Spouse/significant other, Other relatives Support Systems: Spouse/significant other, Children, Parent, Other relatives, Friends/neighbors Type of Residence: Private residence Insurance Resources: Customer service managerelf-pay Financial Resources: Employment Surveyor, quantityinancial Screen Referred: Yes Living Expenses: Psychologist, sport and exerciseent Money Management: Patient, Significant Other Does the patient have any problems obtaining your medications?: Yes (Describe) (Uninsured and no PCP) Home Management: Derrick Patient/Family Preliminary Plans: Return home with Derrick, his Mom and Derrick's Mom there will times he is alone. Pt feels he is readyt o go home now and has only agreed to stay until tomorrow. Otherwise he will sign out AMA. Derrick SandyBeth has been here and gone through education today and will be back tomorrow. Social Work Anticipated Follow Up Needs: HH/OP, Support Group  Clinical Impression Angry gentleman who wants to leave and go home although it is not the best option for him. His girlfriend is aware and knows him and has convinced him to stay today so she could learn his care and diabetes Education. Unsure if pt will follow through on this once home. Gave Derrick information on [place] and aware may not get home health or OP therapies due to being uninsured. Pt plans to leave tomorrow. Will meet with both when Derrick here tomorrow.  Lucy Chrisupree, Afia Messenger G 11/19/2015, 3:11 PM

## 2015-11-19 NOTE — Progress Notes (Signed)
At 2030, patient reports that, "I'm ready to go home!" Spoke at length with patient about need to stay in hospital to work on getting blood sugars better managed, giving insulin, checking blood sugars and checking blood pressure, working with therapy on balance, etc. "everyone's nice, but I just want to go home."  Agreeable to stay the night. Wants to talk to doctor in the morning about leaving. Continues to be non-compliant with diet. Requesting food at least every 2 hours.He says he doesn't own a blood pressure machine or  CBG machine. Did not use insulin PTA. Doesn't seem receptive to  ed R/T CBG's or insulin. Flat affect. Calling for assistance. Bed alarm in use. Ambulated to BR, with LOB X 1. Alfredo MartinezMurray, Idella Lamontagne A

## 2015-11-19 NOTE — Plan of Care (Signed)
Problem: RH KNOWLEDGE DEFICIT Goal: RH STG INCREASE KNOWLEDGE OF DIABETES Pt and/or caregiver will be able to teach back the importance of management of DM, medications, proper diet, and checking of blood sugar.  Outcome: Progressing Education to girlfriend and patient.

## 2015-11-19 NOTE — Progress Notes (Addendum)
Social Work Patient ID: Derrick Robinson, male   DOB: 04/12/1971, 45 y.o.   MRN: [id number] Met with pt, girlfriend, Dan-PA and Jeanne-RN to discuss him leaving and the education needed prior to discharge. Girlfriend is willing to go to therapies and learn his BS and insulin along with pt. Pt adament about leaving today even if it is AMA. Girlfriend reports her Mom can not be there 24 hr with pt and there will be times he is on his alone. Will get equipment, Match and follow up arranged for pt Who may be leaving today.

## 2015-11-19 NOTE — Discharge Summary (Signed)
NAMAlvina Filbert:  Grimsley, Jenaro              ACCOUNT NO.:  [id number]  MEDICAL RECORD NO.:  [id number]  LOCATION:  4M06C                        FACILITY:  [facility]  PHYSICIAN:  Erick ColaceAndrew E. Kirsteins, M.D.DATE OF BIRTH:  06/23/1971  DATE OF ADMISSION:  11/15/2015 DATE OF DISCHARGE:  11/20/2015                              DISCHARGE SUMMARY   DISCHARGE DIAGNOSES: 1. Left posterior inferior cerebellar artery infarct. 2. Sequential compressive devices for deep venous thrombosis     prophylaxis. 3. Hypertension. 4. Uncontrolled diabetes mellitus with poor medical compliance. 5. Tobacco abuse. 6. Constipation.  HISTORY OF PRESENT ILLNESS:  This is a 45 year old right-handed male with history of tobacco abuse, hypertension, diabetes mellitus, and poor medical compliance.  He lives with his girlfriend.  Independent prior to admission.  Presented on November 13, 2015 with persistent dizziness, bouts of nausea, vomiting.  [facility] where [facility] of the head showed a large left-sided cerebellar infarct with some mass effect.  [facility].  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  Mild ventriculomegaly with effacement of 4th ventricle.  Left vertebral artery occlusion.  Placed on intravenous Cardene for blood pressure control.  Carotid Dopplers with no ICA stenosis.  Echocardiogram with ejection fraction of 65%.  No wall motion abnormalities.  Neurology consulted.  The patient did not receive tPA.  Maintained on aspirin for CVA prophylaxis.  Hemoglobin A1c 12.6, insulin therapy initiated.  The patient was admitted for comprehensive rehab program.  PAST MEDICAL HISTORY:  See discharge diagnoses.  SOCIAL HISTORY:  He lives with girlfriend, independent prior to admission.  Functional status upon admission to Rehab Services was moderate assist, 180 feet  2 person handheld assistance.  PHYSICAL EXAMINATION:  VITAL SIGNS:  Blood pressure 143/72, pulse 99, temperature 97, respirations 16. GENERAL:  This was an alert male, oriented to person, place, and time. Mood was somewhat flat, but appropriate.  Made good eye contact with examiner.  Fair awareness of deficits.  Mild limb ataxia. LUNGS:  Clear to auscultation. CARDIAC:  Regular rate and rhythm. ABDOMEN:  Soft, nontender.  Good bowel sounds.  REHABILITATION HOSPITAL COURSE:  The patient was admitted to inpatient rehab services with therapies initiated on a 3-hour daily basis, consisting of physical therapy, occupational therapy, and rehabilitation nursing.  The following issues were addressed during patient's rehabilitation stay.  Pertaining to Mr. Tawana ScaleWilson's left PICA infarct, remained stable.  He had been placed on aspirin as well as Plavix therapy x3 months, then Plavix alone, given large vessel stenosis.  SCDs for DVT prophylaxis.  Blood pressures monitored closely with lisinopril 10 mg daily.  The patient with hemoglobin A1c greater than 13, poor medical compliance, diabetes mellitus, Lantus insulin initiated as well as low-dose Glucophage.  Full diabetic teaching with his girlfriend. The patient was somewhat against diabetic teaching, but continued to follow with therapy plan.  Dietitian was consulted for full education. He did have a history of tobacco abuse, receiving full counseling in regard to cessation of nicotine products.  The patient also remained on Lopressor for blood pressure control.  Arrangements were made [facility]  to follow in [place], [place] as patient did not have a primary care provider.  The patient received weekly collaborative interdisciplinary team conferences to discuss estimated length of stay, family teaching, and any barriers to discharge.  Ambulating with a rolling walker on level terrain, curb, and ramp.  Required occasional cues.  He  was a bit impulsive.  Floor transfers for fall, recovery was modified independent.  Advanced gait stepping over a canes with rolling walker with good technique.  He could gather his belongings for activities of daily living and homemaking, tolerating a regular consistency diet.  The patient continued to adamantly request discharge to home.  Discussed the issue of family teaching being completed and arrangements made for discharge for November 20, 2015.  DISCHARGE MEDICATIONS:  Included: 1. Lantus insulin 20 units subcutaneous at bedtime. 2. Linzess 145 mcg p.o. daily. 3. Lisinopril 10 mg p.o. daily. 4. Glucophage 1000 mg p.o. b.i.d. 5. Lopressor 25 mg p.o. b.i.d. 6. Aspirin 81 mg daily, Plavix 75 mg p.o. daily x3 months, then Plavix     alone.  FOLLOWUP:  The patient would follow up with Dr. Claudette Laws, [facility] directed; Dr. Delia Heady 1 month call for appointment; [facility], [place].  No driving. Several home health agencies in [place] were contacted to arrange for home health therapies however no one was accepting uninsured patients at this time.     Mariam Dollar, P.A.   ______________________________ Erick Colace, M.D.    DA/MEDQ  D:  11/19/2015  T:  11/19/2015  Job:  [id number]  cc:   Pramod P. Pearlean Brownie, MD [facility]

## 2015-11-19 NOTE — Progress Notes (Addendum)
Physical Therapy Session Note  Patient Details  Name: Derrick Robinson MRN: [id number] Date of Birth: June 12, 1971  Today's Date: 11/19/2015 PT Individual Time: [id number]; 1445- 1530 PT Individual Time Calculation (min): 60 min , 45 min  Skilled Therapeutic Interventions/Progress Updates:   Tx 1:  Pt anxious to get home; stating he will leave AMA today. PT advised pt of need to stay longer for focused tx on balance and safety.   Pt compliant with therapy.  Gait with RW on level terrain, curb,mulch, ramp. Pt required cues each stand> sit due to unsafe hand placement, but did not comply for first 5 trials with cueing to reach back before sitting. Pt impulsive on curb, needing mod assist when descending due to "tossing" RW down before stepping, mod assist needed for balance recovery.  Floor transfer for fall recovery modified independent. Up/down stairs with R rail,, with mild LOB recovered independently at top of stairs, and LOB requiring mod assist at bottom . Retrieved small item from floor using RW.   neuromuscular re-education via demo for sustained heel cord stretch x 2 min in standing on wedge with bil UE support fading to 0UE support, with VCs for reacting to A-P sway. Forced use for activity of biased to L standing, during downward retrieving of discs on L with L hand  and placing on table in front, with dual task of choosing numbered disk requested.  Frequent LOB L wiith poor trunk righting and R hip abduction activation.  PT provided external perturbations to elicit balance strategies; bil ankle strategies noted, absent hip and stepping strategy.  Delayed hip strategy eventually emerged with perturbations.  Pt c/o dizziness during session x 2 and requested to lie down; no description of vertigo.  Advanced gait stepping over 8 canes with RW, with good technique 7/8 after instruction.  When returning to sit, pt had LOB to L and he turned L to sit;  Total assist needed to prevent fall.    Toilet transfer. Pt c/o dizziness and feeling sick.  Returned pt to bed and felt better lying down. RN informed.  Bed alarm set and all needs left within reach.  Tx 2:  Girlfriend present, attempting to convince pt to stay here longer.  Gait in room and on unit with RW, to toilet, sink for BM.  Pt had LOB against wall to L as he transferred to toilet.  Pt c/o dizziness throughout session, requesting to return to bed.   neuromuscular re-education via demo for head turns vertically in sitting keeping eyes stationary on target approx 18" away, , x 2, x 5 turns each, with nystagmus elicited, and c/o of nausea but not vertigo.  Vitals below.  Pt requested lying down, which he did in gym.  Pt left resting in bed, with all needs within reach and bed alarm set.     Therapy Documentation Precautions:  Precautions Precautions: Fall Precaution Comments: significant balance deficits and poor safety awareness and awareness of deficits.  Restrictions Weight Bearing Restrictions: No      See Function Navigator for Current Functional Status.   Therapy/Group: Individual Therapy  Derrick Robinson 11/19/2015, 11:14 AM

## 2015-11-19 NOTE — IPOC Note (Addendum)
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) Active Problems:   HTN (hypertension)     Functional Problem List: Nursing Perception, Motor, Pain  PT Behavior, Balance, Endurance, Motor, Perception, Safety, Sensory  OT    SLP    TR         Basic ADL's: OT       Advanced  ADL's: OT       Transfers: PT Bed Mobility, Bed to Chair, Car, [employer], Floor  OT       Locomotion: PT Ambulation, Psychologist, prison and probation services, Stairs     Additional Impairments: OT    SLP        TR      Anticipated Outcomes Item Anticipated Outcome  Self Feeding    Swallowing      Basic self-care     Toileting      Bathroom Transfers    Bowel/Bladder  Mod I  Transfers  mod I with LRAD  Locomotion  Mod I-supervision with LRAD  Communication     Cognition     Pain  <3  Safety/Judgment  Supervision   Therapy Plan: PT Intensity: Minimum of 1-2 x/day ,45 to 90 minutes PT Frequency: 5 out of 7 days PT Duration Estimated Length of Stay: 10-14 days            Team Interventions: Nursing Interventions Patient/Family Education, Disease Management/Prevention, Discharge Planning  PT interventions Ambulation/gait training, Warden/ranger, Cognitive remediation/compensation, Community reintegration, Discharge planning, Disease management/prevention, DME/adaptive equipment instruction, Functional mobility training, Neuromuscular re-education, Patient/family education, Psychosocial support, Skin care/wound management, Splinting/orthotics, Stair training, Therapeutic Activities, Therapeutic Exercise, UE/LE Strength taining/ROM, UE/LE Coordination activities, Visual/perceptual remediation/compensation, Wheelchair propulsion/positioning  OT Interventions    SLP Interventions    TR Interventions     SW/CM Interventions  Discharge Planning, Psychosocial Assessment, Pt/Family Education    Team Discharge Planning: Destination: PT-Home ,OT-   , SLP-  Projected Follow-up: PT-Home health PT, Outpatient PT, OT-   , SLP-  Projected Equipment Needs: PT-Rolling walker with 5" wheels, OT-  , SLP-  Equipment Details: PT- , OT-  Patient/family involved in discharge planning: PT- Patient,  OT- , SLP-   MD ELOS: 5d Medical Rehab Prognosis:  Excellent Assessment: 45 y.o. right handed male with history of tobacco abuse, hypertension, diabetes mellitus. Patient on no scheduled medications at time of admission. Lives with girlfriend ankle friend's mother. 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 Girlfriend works full time but girlfriends mother can assist. One level home with 3 steps to entry. Presented 11/13/2015 with persistent dizziness, bouts of nausea and vomiting. Initially presented to [facility] where a CT of the head showed a large left-sided cerebellar stroke with some mass effect. He was transferred to [facility] for further evaluation. 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 Mild ventriculomegaly with effacement of fourth ventricle. Left vertebral artery occlusion.. Intravenous Cardene initiated for blood pressure control. Carotid Dopplers 1-39 percent bilateral ICA stenosis. Echocardiogram with ejection fraction 65% no wall motion abnormalities. Neurology consulted. Patient did not receive TPA. Currently maintained on aspirin for CVA prophylaxis. Hemoglobin A1c of 12.6 with insulin therapy initiated. Tolerating a regular diet   Now requiring 24/7 Rehab RN,MD, as well as CIR level PT, OT .  Treatment team will focus on ADLs and mobility with  goals set at Mod I  See Team Conference Notes for weekly updates to the plan of care

## 2015-11-19 NOTE — Progress Notes (Signed)
Patient information reviewed and entered into eRehab system by Findley Blankenbaker, RN, CRRN, PPS Coordinator.  Information including medical coding and functional independence measure will be reviewed and updated through discharge.    

## 2015-11-19 NOTE — Care Management Note (Signed)
Inpatient Rehabilitation Center Individual Statement of Services  Patient Name:  Derrick Robinson  Date:  11/19/2015  Welcome to the Inpatient Rehabilitation Center.  Our goal is to provide you with an individualized program based on your diagnosis and situation, designed to meet your specific needs.  With this comprehensive rehabilitation program, you will be expected to participate in at least 3 hours of rehabilitation therapies Monday-Friday, with modified therapy programming on the weekends.  Your rehabilitation program will include the following services:  Physical Therapy (PT), Occupational Therapy (OT), Speech Therapy (ST), 24 hour per day rehabilitation nursing, Therapeutic Recreaction (TR), Neuropsychology, Case Management (Social Worker), Rehabilitation Medicine, Nutrition Services and Pharmacy Services  Weekly team conferences will be held on Wednesday to discuss your progress.  Your Social Worker will talk with you frequently to get your input and to update you on team discussions.  Team conferences with you and your family in attendance may also be held.  Expected length of stay: 10-14 days  Overall anticipated outcome: mod/i-supervision Pt not agreeable to stay this long to reach these goals  Depending on your progress and recovery, your program may change. Your Social Worker will coordinate services and will keep you informed of any changes. Your Social Worker's name and contact numbers are listed  below.  The following services may also be recommended but are not provided by the Inpatient Rehabilitation Center:   Driving Evaluations  Home Health Rehabiltiation Services  Outpatient Rehabilitation Services  Vocational Rehabilitation   Arrangements will be made to provide these services after discharge if needed.  Arrangements include referral to agencies that provide these services.  Your insurance has been verified to be:  None Your primary doctor is:  none  Pertinent  information will be shared with your doctor and your insurance company.  Social Worker:  Dossie DerBecky Veronique Warga, SW [phone] or ([phone]) [phone]  Information discussed with and copy given to patient by: Lucy Chrisupree, Alexandria Shiflett G, 11/19/2015, 2:33 PM

## 2015-11-19 NOTE — Progress Notes (Signed)
Social Work Patient ID: Derrick FilbertBradley Robinson, male   DOB: 30-Jun-1971, 45 y.o.   MRN: [id number] [place] home health agencies [place] and can not find one to take uninsured pt's. Have let Dan-PA know of this. So education needs to be completed prior to discharge due to may not have any home health follow up.

## 2015-11-19 NOTE — Plan of Care (Signed)
Problem: RH KNOWLEDGE DEFICIT Goal: RH STG INCREASE KNOWLEDGE OF DIABETES Pt and/or caregiver will be able to teach back the importance of management of DM, medications, proper diet, and checking of blood sugar.  Outcome: Not Progressing Non-compliant with diabetic diet.  Goal: RH STG INCREASE KNOWLEDGE OF HYPERTENSION Min A-State BP parameters for HTN  Outcome: Not Progressing Non-compliant of diet.

## 2015-11-19 NOTE — Progress Notes (Signed)
Subjective/Complaints: Wants to go home, threatening AMA, discussed need to optimize CBG, BP prior to d/c.  He understands "but I just want to go home" Has fiancee who can help, no PCP Used to be on metformin but d/ced after CBG too low after he lost 50lb, doesn't remember MD name ROS- neg N/V/D, no CP or SOB Objective: Vital Signs: Blood pressure 162/96, pulse 89, temperature 97.6 F (36.4 C), temperature source Oral, resp. rate 18, weight 82.101 kg (181 lb), SpO2 100 %. No results found. Results for orders placed or performed during the hospital encounter of 11/16/15 (from the past 72 hour(s))  Glucose, capillary     Status: Abnormal   Collection Time: 11/16/15  8:02 PM  Result Value Ref Range   Glucose-Capillary 278 (H) 65 - 99 mg/dL  Glucose, capillary     Status: Abnormal   Collection Time: 11/17/15 12:07 AM  Result Value Ref Range   Glucose-Capillary 240 (H) 65 - 99 mg/dL  Glucose, capillary     Status: Abnormal   Collection Time: 11/17/15  4:04 AM  Result Value Ref Range   Glucose-Capillary 170 (H) 65 - 99 mg/dL  Glucose, capillary     Status: Abnormal   Collection Time: 11/17/15  8:37 AM  Result Value Ref Range   Glucose-Capillary 279 (H) 65 - 99 mg/dL   Comment 1 Notify RN   Glucose, capillary     Status: Abnormal   Collection Time: 11/17/15 12:14 PM  Result Value Ref Range   Glucose-Capillary 248 (H) 65 - 99 mg/dL  Glucose, capillary     Status: Abnormal   Collection Time: 11/17/15  4:06 PM  Result Value Ref Range   Glucose-Capillary 370 (H) 65 - 99 mg/dL   Comment 1 Notify RN   Glucose, capillary     Status: Abnormal   Collection Time: 11/17/15  7:59 PM  Result Value Ref Range   Glucose-Capillary 346 (H) 65 - 99 mg/dL  Glucose, capillary     Status: Abnormal   Collection Time: 11/18/15 12:00 AM  Result Value Ref Range   Glucose-Capillary 207 (H) 65 - 99 mg/dL  Glucose, capillary     Status: Abnormal   Collection Time: 11/18/15  4:05 AM  Result Value Ref  Range   Glucose-Capillary 185 (H) 65 - 99 mg/dL  Glucose, capillary     Status: Abnormal   Collection Time: 11/18/15  7:23 AM  Result Value Ref Range   Glucose-Capillary 204 (H) 65 - 99 mg/dL  Glucose, capillary     Status: Abnormal   Collection Time: 11/18/15 11:35 AM  Result Value Ref Range   Glucose-Capillary 242 (H) 65 - 99 mg/dL   Comment 1 Notify RN   Glucose, capillary     Status: Abnormal   Collection Time: 11/18/15  4:05 PM  Result Value Ref Range   Glucose-Capillary 348 (H) 65 - 99 mg/dL  Glucose, capillary     Status: Abnormal   Collection Time: 11/18/15  8:04 PM  Result Value Ref Range   Glucose-Capillary 370 (H) 65 - 99 mg/dL  Glucose, capillary     Status: Abnormal   Collection Time: 11/18/15 11:54 PM  Result Value Ref Range   Glucose-Capillary 263 (H) 65 - 99 mg/dL  Glucose, capillary     Status: Abnormal   Collection Time: 11/19/15  3:59 AM  Result Value Ref Range   Glucose-Capillary 174 (H) 65 - 99 mg/dL     HEENT: normal Cardio: RRR and no murmur Resp:  CTA B/L and unlabored GI: BS positive and non tender and non distended Extremity:  Pulses positive and No Edema Skin:   Intact Neuro: Alert/Oriented, Cranial Nerve II-XII normal, Normal Motor and Other wide based stance Musc/Skel:  Normal Gen NAD Amb with walker SBA  Assessment/Plan: 1. 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 which require 3+ hours per day of interdisciplinary therapy in a comprehensive inpatient rehab setting. Physiatrist is providing close team supervision and 24 hour management of active medical problems listed below. Physiatrist and rehab team continue to assess barriers to discharge/monitor patient progress toward functional and medical goals. FIM: Function - Bathing Position: Standing at sink Body parts bathed by patient: Right arm, Left arm, Chest, Abdomen, Front perineal area, Right upper leg, Left upper leg Body parts bathed by helper: Left lower leg, Right lower leg, Back,  Buttocks Assist Level: Touching or steadying assistance(Pt > 75%)  Function- Upper Body Dressing/Undressing What is the patient wearing?: Pull over shirt/dress Pull over shirt/dress - Perfomed by patient: Thread/unthread right sleeve, Thread/unthread left sleeve, Put head through opening, Pull shirt over trunk Assist Level: Touching or steadying assistance(Pt > 75%) Function - Lower Body Dressing/Undressing What is the patient wearing?: Underwear, Pants, Non-skid slipper socks Position: Sitting EOB Underwear - Performed by patient: Thread/unthread right underwear leg, Thread/unthread left underwear leg, Pull underwear up/down Pants- Performed by patient: Pull pants up/down, Thread/unthread left pants leg, Thread/unthread right pants leg Pants- Performed by helper: Thread/unthread right pants leg, Thread/unthread left pants leg, Pull pants up/down Non-skid slipper socks- Performed by patient: Don/doff right sock, Don/doff left sock Assist for footwear: Supervision/touching assist Assist for lower body dressing: Touching or steadying assistance (Pt > 75%)  Function - Toileting Toileting steps completed by patient: Adjust clothing prior to toileting, Performs perineal hygiene, Adjust clothing after toileting Toileting Assistive Devices: Grab bar or rail Assist level: Touching or steadying assistance (Pt.75%)  Function - Archivist transfer assistive device: Grab bar, Walker Assist level to toilet: Touching or steadying assistance (Pt > 75%) Assist level from toilet: Touching or steadying assistance (Pt > 75%)  Function - Chair/bed transfer Chair/bed transfer method: Stand pivot Chair/bed transfer assist level: Moderate assist (Pt 50 - 74%/lift or lower) Chair/bed transfer assistive device: Armrests, Bedrails Chair/bed transfer details: Verbal cues for sequencing, Verbal cues for technique, Verbal cues for precautions/safety, Visual cues/gestures for  precautions/safety  Function - Locomotion: Wheelchair Will patient use wheelchair at discharge?: No Wheelchair activity did not occur: N/A Wheel 50 feet with 2 turns activity did not occur: N/A Wheel 150 feet activity did not occur: N/A Function - Locomotion: Ambulation Assistive device: Walker-rolling Max distance: 140 ft Assist level: Moderate assist (Pt 50 - 74%) Assist level: Moderate assist (Pt 50 - 74%) Walk 50 feet with 2 turns activity did not occur: Safety/medical concerns Assist level: Moderate assist (Pt 50 - 74%) Walk 150 feet activity did not occur: Safety/medical concerns Assist level: Maximal assist (Pt 25 - 49%) (with RW)  Function - Comprehension Comprehension: Auditory Comprehension assist level: Follows basic conversation/direction with no assist  Function - Expression Expression: Verbal Expression assist level: Expresses complex ideas: With no assist  Function - Social Interaction Social Interaction assist level: Interacts appropriately 75 - 89% of the time - Needs redirection for appropriate language or to initiate interaction.  Function - Problem Solving Problem solving assist level: Solves basic problems with no assist  Function - Memory Memory assist level: Complete Independence: No helper Patient normally able to recall (first 3 days only):  Current season, Staff names and faces, That he or she is in a hospital  1. Dizziness,  decreased functional mobility secondary to left PICA infarct/petechial hemorrhagic transformation.From functional standpoint ok for OP or HH PT    [facility]                             2. DVT Prophylaxis/Anticoagulation: SCDs. Monitor for any signs of DVT -mobilize 3. Pain Management: Tylenol as needed 4. Hypertension. Lisinopril 10 mg daily, Lopressor 25 mg twice a day. Permissive hypertension early on. 5. Neuropsych: This patient is capable of making decisions on his own behalf.Wants  to go home despite education regarding CVA risk factors and recommendation of 2-3 more days of hospitalization to optimize BP and CBG 6. Skin/Wound Care: Routine skin checks 7. Fluids/Electrolytes/Nutrition: Routine I&O with follow-up chemistries -encourage adequate PO 8. Uncontrolled diabetes mellitus. Hemoglobin A1c 12.6. Check blood sugars before meals and at bedtime. Diabetic teaching.Lantus 15 unit QHS, add metformin, teach injection 9. Tobacco abuse. Counseling 10. Constipation. Linzess - better 11.  + opiate in UDS- pt states it was from an ER, + amphetamine- pt couldn't account from this no Rx for ritalin or similar med LOS (Days) 3 A FACE TO FACE EVALUATION WAS PERFORMED  KIRSTEINS,ANDREW E 11/19/2015, 7:49 AM

## 2015-11-19 NOTE — Progress Notes (Signed)
Occupational Therapy Session Note  Patient Details  Name: Derrick FilbertBradley Battaglia MRN: [id number] Date of Birth: 30-Mar-1971  Today's Date: 11/19/2015 OT Individual Time: 1117-1200 OT Individual Time Calculation (min): 43 min   Skilled Therapeutic Interventions/Progress Updates:    Pt completed ambulation to the therapy gym with mod assist and no assistive device.  Reports of slight dizziness with mobility stated.  In sitting, examined pt's visual tracking and VOR.  Noted slight nystagmus with fast beat the the right during fixed midline gaze and with all tracking.  Increased rate of nystagmus when tracking to the left of midline noted.  Delayed VOR also noted with quick head turn to the right as well.  Pt with increased dizziness after performing these tests as well as slight nausea.  No dizziness provoked with transitions supine to sit and sit to supine or rolling.  Educated pt on compensatory strategies of fixing his gaze on a target during mobility in order to decreased dizziness.  Completed this on the way back to his room from session and he reports feeling slightly better.  Pt left sitting EOB with his girlfriend present in preparation for lunch.    Therapy Documentation Precautions:  Precautions Precautions: Fall Precaution Comments: significant balance deficits and poor safety awareness and awareness of deficits.  Restrictions Weight Bearing Restrictions: No  Pain:  No report of pain  ADL: See Function Navigator for Current Functional Status.   Therapy/Group: Individual Therapy  Lewie Deman OTR/L 11/19/2015, 3:59 PM

## 2015-11-19 NOTE — Discharge Summary (Signed)
Discharge summary job # [id number]

## 2015-11-19 NOTE — Progress Notes (Signed)
RN educated girlfriend on BP management, BP medication and proper diet. RN also educated girlfriend on insulin management; sliding scale, and lantus. Educated and reviewed metformin medication. RN explained the importnace of following a proper diabetic diet. Handouts given of all medications, diet management, and sliding scale for insulin.  Girlfriend had verbal understanding and provided proper teach back to RN.  RN attempted to educate pt on same information with pt refusing to learn. Pt states, "she can take care of it." RN provided reinforcement of importance of self management.

## 2015-11-19 NOTE — Progress Notes (Signed)
Physical Therapy Session Note  Patient Details  Name: Derrick FilbertBradley Robinson MRN: [id number] Date of Birth: Oct 15, 1970    Skilled Therapeutic Interventions/Progress Updates:    3:56 pm - Pt received sitting on EOB eating crackers & peanut butter. Educated pt on PT session & pt refused to participate. PT educated pt on importance of therapy to increase his balance & safety, offering for pt to attempt balance game on Wii, stair training, or exercises in room. Pt reported "I think I'm done for today" & refused to participate in any PT treatment. Pt left supine in bed.  General: PT Amount of Missed Time (min): 60 Minutes PT Missed Treatment Reason:  (Pt refused to participate.)    Sandi MariscalVictoria M Oval Cavazos 11/19/2015, 5:19 PM

## 2015-11-20 ENCOUNTER — Inpatient Hospital Stay (HOSPITAL_COMMUNITY): Payer: Self-pay | Admitting: Physical Therapy

## 2015-11-20 ENCOUNTER — Inpatient Hospital Stay (HOSPITAL_COMMUNITY): Payer: Self-pay | Admitting: Occupational Therapy

## 2015-11-20 DIAGNOSIS — R270 Ataxia, unspecified: Secondary | ICD-10-CM

## 2015-11-20 LAB — GLUCOSE, CAPILLARY: GLUCOSE-CAPILLARY: 274 mg/dL — AB (ref 65–99)

## 2015-11-20 MED ORDER — METFORMIN HCL 500 MG PO TABS
1000.0000 mg | ORAL_TABLET | Freq: Two times a day (BID) | ORAL | Status: DC
  Administered 2015-11-20: 1000 mg via ORAL
  Filled 2015-11-20: qty 2

## 2015-11-20 MED ORDER — INSULIN GLARGINE 100 UNIT/ML ~~LOC~~ SOLN: 20.0000 [IU] | Freq: Every day | SUBCUTANEOUS | Status: DC

## 2015-11-20 MED ORDER — ASPIRIN 81 MG PO CHEW: 81.0000 mg | CHEWABLE_TABLET | Freq: Every day | ORAL | Status: DC

## 2015-11-20 MED ORDER — LISINOPRIL 10 MG PO TABS: 10.0000 mg | ORAL_TABLET | Freq: Every day | ORAL | Status: DC

## 2015-11-20 MED ORDER — CLOPIDOGREL BISULFATE 75 MG PO TABS: 75.0000 mg | ORAL_TABLET | Freq: Every day | ORAL | Status: DC

## 2015-11-20 MED ORDER — METOPROLOL TARTRATE 25 MG PO TABS: 25.0000 mg | ORAL_TABLET | Freq: Two times a day (BID) | ORAL | Status: DC

## 2015-11-20 MED ORDER — METFORMIN HCL 1000 MG PO TABS: 1000.0000 mg | ORAL_TABLET | Freq: Two times a day (BID) | ORAL | Status: DC

## 2015-11-20 NOTE — Discharge Instructions (Signed)
Inpatient Rehab Discharge Instructions  Derrick Robinson Discharge date and time: No discharge date for patient encounter.   Activities/Precautions/ Functional Status: Activity: activity as tolerated Diet: diabetic diet Wound Care: none needed Functional status:  ___ No restrictions     ___ Walk up steps independently ___ 24/7 supervision/assistance   ___ Walk up steps with assistance ___ Intermittent supervision/assistance  ___ Bathe/dress independently ___ Walk with walker     _x STROKE/TIA DISCHARGE INSTRUCTIONS SMOKING Cigarette smoking nearly doubles your risk of having a stroke & is the single most alterable risk factor  If you smoke or have smoked in the last 12 months, you are advised to quit smoking for your health.  Most of the excess cardiovascular risk related to smoking disappears within a year of stopping.  Ask you doctor about anti-smoking medications  [place] Quit Line: 1-800-QUIT NOW  Free Smoking Cessation Classes [phone]  CHOLESTEROL Know your levels; limit fat & cholesterol in your diet  Lipid Panel     Component Value Date/Time   CHOL 229* 11/14/2015 0518   TRIG 140 11/14/2015 0518   HDL 60 11/14/2015 0518   CHOLHDL 3.8 11/14/2015 0518   VLDL 28 11/14/2015 0518   LDLCALC 141* 11/14/2015 0518      Many patients benefit from treatment even if their cholesterol is at goal.  Goal: Total Cholesterol (CHOL) less than 160  Goal:  Triglycerides (TRIG) less than 150  Goal:  HDL greater than 40  Goal:  LDL (LDLCALC) less than 100   BLOOD PRESSURE American Stroke Association blood pressure target is less that 120/80 mm/Hg  Your discharge blood pressure is:  BP: (!) 162/96 mmHg  Monitor your blood pressure  Limit your salt and alcohol intake  Many individuals will require more than one medication for high blood pressure  DIABETES (A1c is a blood sugar average for last 3 months) Goal HGBA1c is under 7% (HBGA1c is blood sugar average for last 3 months)    Diabetes:    Lab Results  Component Value Date   HGBA1C 12.6* 11/14/2015     Your HGBA1c can be lowered with medications, healthy diet, and exercise.  Check your blood sugar as directed by your physician  Call your physician if you experience unexplained or low blood sugars.  PHYSICAL ACTIVITY/REHABILITATION Goal is 30 minutes at least 4 days per week  Activity: Increase activity slowly, Therapies: Physical Therapy: Home Health Return to work:   Activity decreases your risk of heart attack and stroke and makes your heart stronger.  It helps control your weight and blood pressure; helps you relax and can improve your mood.  Participate in a regular exercise program.  Talk with your doctor about the best form of exercise for you (dancing, walking, swimming, cycling).  DIET/WEIGHT Goal is to maintain a healthy weight  Your discharge diet is: Diet Carb Modified Fluid consistency:: Thin; Room service appropriate?: Yes  liquids Your height is:    Your current weight is: Weight: 82.101 kg (181 lb) Your Body Mass Index (BMI) is:     Following the type of diet specifically designed for you will help prevent another stroke.  Your goal weight range is:    Your goal Body Mass Index (BMI) is 19-24.  Healthy food habits can help reduce 3 risk factors for stroke:  High cholesterol, hypertension, and excess weight.  RESOURCES Stroke/Support Group:  Call [phone]   STROKE EDUCATION PROVIDED/REVIEWED AND GIVEN TO PATIENT Stroke warning signs and symptoms How to activate  emergency medical system (call 911). Medications prescribed at discharge. Need for follow-up after discharge. Personal risk factors for stroke. Pneumonia vaccine given:  Flu vaccine given:  My questions have been answered, the writing is legible, and I understand these instructions.  I will adhere to these goals & educational materials that have been provided to me after my discharge from the hospital.    __  Bathe/dress with assistance ___ Walk Independently    ___ Shower independently ___ Walk with assistance    ___ Shower with assistance ___ No alcohol     ___ Return to work/school ________  Special Instructions:     COMMUNITY REFERRALS UPON DISCHARGE:   NONE-COULD NOT FIND A HOME HEALTH AGENCY THAT WOULD TAKE A UNINSURED PATIENT-PT AND [name] AWARE OF THIS   Medical Equipment/Items Ordered:HAS NEEDED EQUIPMENT FROM OTHER FAMILY MEMBERS   Other:[facility] [address] [place] [id number] [phone] NEW PATIENT APPLICATION GIVEN TO PT/GIRLFRIEND TO FOLLOW UP WITH      My questions have been answered and I understand these instructions. I will adhere to these goals and the provided educational materials after my discharge from the hospital.  Patient/Caregiver Signature _______________________________ Date __________  Clinician Signature _______________________________________ Date __________  Please bring this form and your medication list with you to all your follow-up doctor's appointments.

## 2015-11-20 NOTE — Plan of Care (Signed)
Problem: RH Balance Goal: LTG Patient will maintain dynamic standing balance (PT) LTG: Patient will maintain dynamic standing balance with assistance during mobility activities (PT)  Outcome: Not Met (add Reason) Patient requires up to max A with dynamic standing balance due to frequent LOB and decreased awareness of deficits/impaired safety awareness.

## 2015-11-20 NOTE — Plan of Care (Signed)
Problem: RH Bed to Chair Transfers Goal: LTG Patient will perform bed/chair transfers w/assist (PT) LTG: Patient will perform bed/chair transfers with assistance, with/without cues (PT).  Outcome: Not Met (add Reason) Patient requires supervision due to impaired standing balance, decreased awareness of deficits, and impaired safety awareness.

## 2015-11-20 NOTE — Progress Notes (Signed)
Physical Therapy Discharge Summary  Patient Details  Name: Derrick Robinson MRN: [id number] Date of Birth: Jul 30, 1970  Today's Date: 11/20/2015 PT Individual Time: 1000-1030 PT Individual Time Calculation (min): 30 min    Patient has met 3 of 9 long term goals due to decreased dizziness. Patient to discharge at an ambulatory level [place].   Patient's care partner is independent to provide the necessary physical assistance at discharge.  Reasons goals not met: Patient requesting to leave hospital before recommended discharge date.   Recommendation:  Patient instructed in performing VOR x1 viewing exercises for vestibular HEP 3-4x/day, verbalized understanding.    Equipment: No equipment provided  Reasons for discharge: Patient requesting to leave hospital  Patient/family agrees with progress made and goals achieved: Yes  Skilled Therapeutic Intervention Patient sitting edge of bed upon arrival, stating he is waiting for girlfriend to arrive to take him home. Patient ambulated around room without AD to gather belongings with supervision-mod A and heavy dependence on walls, furniture, etc for balance. Patient with increased staggering/LOB with ambulation requiring up to mod A to recover to prevent fall and dependent on railing for negotiating ramp, uneven surfaces, and curb step. Patient performed simulated car transfer to sedan height, stairs with 2 rails, and picking item off floor with supervision. Patient educated on benefits of use of RW to prevent fall and increase independence but patient continued to refuse use of RW. Patient reported he was "finished" and girlfriend would be arriving soon, left supine in bed with bed alarm on and needs in reach. Reinforced recommendation to perform vestibular HEP provided by OT 3-4x/day, patient verbalized understanding with no further questions/concerns regarding discharge.    PT Discharge Precautions/Restrictions Precautions Precautions:  Fall Precaution Comments: significant balance deficits, poor safety awareness, decreased awareness of deficits Restrictions Weight Bearing Restrictions: No Pain Pain Assessment Pain Assessment: No/denies pain Vision/Perception   Defer to OT discharge summary  Cognition Orientation Level: Oriented X4 Sensation Sensation Light Touch: Appears Intact Hot/Cold: Appears Intact Proprioception: Appears Intact Coordination Gross Motor Movements are Fluid and Coordinated: No Fine Motor Movements are Fluid and Coordinated: No Motor  Motor Motor: Ataxia  Mobility Bed Mobility Bed Mobility: Rolling Right;Rolling Left;Supine to Sit;Sit to Supine Rolling Right: 6: Modified independent (Device/Increase time) Rolling Left: 6: Modified independent (Device/Increase time) Supine to Sit: 6: Modified independent (Device/Increase time) Sit to Supine: 6: Modified independent (Device/Increase time) Transfers Transfers: Yes Sit to Stand: 5: Supervision Stand to Sit: 5: Supervision Locomotion  Ambulation Ambulation: Yes Ambulation/Gait Assistance: 3: Mod assist Ambulation Distance (Feet): 150 Feet Assistive device: None Gait Gait: Yes Gait Pattern: Impaired Gait Pattern: Ataxic;Scissoring;Step-through pattern (staggering) Gait velocity: decreased Stairs / Additional Locomotion Stairs: Yes Stairs Assistance: 5: Supervision Stair Management Technique: Two rails;Alternating pattern;Step to pattern;Forwards Number of Stairs: 12 Height of Stairs: 6 Ramp: 5: Supervision (using rail) Curb: 5: Supervision (using rail) Product manager Mobility: No  Trunk/Postural Assessment  Cervical Assessment Cervical Assessment: Within Functional Limits Thoracic Assessment Thoracic Assessment: Within Functional Limits Lumbar Assessment Lumbar Assessment: Within Functional Limits Postural Control Postural Control: Deficits on evaluation Protective Responses: impaired/delayed, insufficient  balance reactions  Balance Static Standing Balance Static Standing - Balance Support: Bilateral upper extremity supported Static Standing - Level of Assistance: 5: Stand by assistance Dynamic Standing Balance Dynamic Standing - Balance Support: No upper extremity supported;During functional activity Dynamic Standing - Level of Assistance: 3: Mod assist Extremity Assessment  RUE Assessment RUE Assessment: Within Functional Limits LUE Assessment LUE Assessment: Within Functional Limits RLE  Assessment RLE Assessment: Within Functional Limits LLE Assessment LLE Assessment: Within Functional Limits   See Function Navigator for Current Functional Status.  Carney Living A 11/20/2015, 10:45 AM

## 2015-11-20 NOTE — Progress Notes (Signed)
Occupational Therapy Discharge Summary  Patient Details  Name: Derrick Robinson MRN: [id number] Date of Birth: 18-Dec-1970  Today's Date: 11/20/2015 OT Individual Time: 4431-5400 OT Individual Time Calculation (min): 75 min    Session Note:  Pt ambulate to the shower for bathing sitting on shower bench.  He reports already having a shower seat for use at home as well.  Mod instructional cueing for thoroughness as he initially just washed his head 3 times instead of progressing to washing his other parts.  Questioning cueing needed to complete bathing and drying off.  He was able to finish dressing sit to stand at the EOB with supervision and then brush his teeth in standing at the sink with supervision.  Slight hip/pelvis ataxia noted in standing.  Educated pt on X1 exercises for vestibular treatment.  Pt returned demonstrated them by focusing on the letter "A"1-2 mins  moving his head horizontally and also vertically.  He did not maintain for 1-2 mins as instructions recommended secondary to decreased selective attention level.  Had pt ambulate with walker up the hallway to the [facility] tower as he wanted to get some "fresh air".  He was able to complete with supervision.  Upon returning to the room had him ambulate to and from the toileting without use of an assistive device.  Frequent LOB to the right with mod assist needed to keep pt from falling.  Re-emphasized the need to continue therapy and work on balance, and head/eye movements but pt still stating he was going home earlier this am.  Pt left sitting EOB with bed alarm in place.    Patient has met 5 of 14 long term goals due to improved balance, ability to compensate for deficits and functional use of  RIGHT upper extremity.  Patient to discharge at overall Supervision level.  Patient's care partner did not complete educational training for discharge and from therapy standpoint pt is not safe to be alone at home at this time. Pt still with significant  balance deficits as well as decreased understanding of deficits and how they can lead to him having a fall if left alone by himself.  Do not agree with pt being ready for discharge at this time.   Reasons goals not met: Goals not met secondary to pt leaving prior to original ELOS secondary to request and MD agreement.  Pt currently supervision level for most selfcare tasks with use of RW for balance.  He can require as much as mod assist if ambulating to the toilet, shower, or when mobilizing in the room without assistive device.    Recommendation:  Recommend continued OT therapy via home health, however pt with no insurance and will not be eligible to receive services at outpatient or [facility].  Feel he is at a high risk of falling at home as his balance is not at a safe modified independent level and he does not exhibit the anticipatory awareness to understand this.  His girlfriend has been made aware that he needs 24 hour supervision for safety at this time.  Unsure if she will make arrangements to provide this.    Equipment: No equipment provided  Reasons for discharge: discharge from hospital  Patient/family agrees with progress made and goals achieved: Yes  OT Discharge Precautions/Restrictions  Precautions Precautions: Fall Precaution Comments: significant balance deficits, poor safety awareness, decreased awareness of deficits  Pain Pain Assessment Pain Assessment: No/denies pain ADL  See Function Section of chart for details  Vision/Perception  Vision- History  Baseline Vision/History: Wears glasses Wears Glasses: Reading only Vision- Assessment Vision Assessment?: Yes Eye Alignment: Within Functional Limits Ocular Range of Motion: Within Functional Limits Alignment/Gaze Preference: Within Defined Limits Tracking/Visual Pursuits: Decreased smoothness of vertical tracking;Decreased smoothness of horizontal tracking Visual Fields: No apparent deficits Additional Comments: Slight  nystagmus noted with midline gaze increasing with left lateral gaze during scanning in both the superior and inferior fields.    Cognition Overall Cognitive Status: Impaired/Different from baseline Arousal/Alertness: Awake/alert Orientation Level: Oriented X4 Attention: Sustained;Selective Focused Attention: Appears intact Sustained Attention: Appears intact Memory: Appears intact Awareness: Impaired Awareness Impairment: Anticipatory impairment Problem Solving: Impaired Problem Solving Impairment: Functional complex;Functional basic Decision Making: Impaired Decision Making Impairment: Functional complex;Functional basic Safety/Judgment: Impaired Comments: Pt with decreased safety awareness related to balance and performance of ADLs.   Sensation Sensation Light Touch: Appears Intact Stereognosis: Appears Intact Hot/Cold: Appears Intact Proprioception: Appears Intact Coordination Gross Motor Movements are Fluid and Coordinated: Yes (with UE use) Fine Motor Movements are Fluid and Coordinated: No Coordination and Movement Description: Pt with slight FM coordination deficits in the RUE compared to the left but do not appear to affect  ADL function. Motor  Motor Motor: Ataxia Motor - Discharge Observations: Pt with slight ataxia in his pelvis and trunk with static standing at the sink. Mobility  Bed Mobility Bed Mobility: Rolling Right;Rolling Left;Supine to Sit;Sit to Supine Transfers Transfers: Stand to Sit;Sit to Stand Sit to Stand: 5: Supervision Stand to Sit: 5: Supervision  Trunk/Postural Assessment  Cervical Assessment Cervical Assessment: Within Functional Limits Thoracic Assessment Thoracic Assessment: Within Functional Limits Lumbar Assessment Lumbar Assessment: Within Functional Limits Postural Control Postural Control: Deficits on evaluation Protective Responses: impaired/delayed, insufficient balance reactions  Balance Balance Balance Assessed: Yes Static  Sitting Balance Static Sitting - Balance Support: No upper extremity supported Static Sitting - Level of Assistance: 7: Independent Dynamic Sitting Balance Dynamic Sitting - Balance Support: Feet supported Dynamic Sitting - Level of Assistance: 6: Modified independent (Device/Increase time) Static Standing Balance Static Standing - Balance Support: Bilateral upper extremity supported Static Standing - Level of Assistance: 5: Stand by assistance Dynamic Standing Balance Dynamic Standing - Balance Support: During functional activity;Bilateral upper extremity supported Dynamic Standing - Level of Assistance: 5: Stand by assistance Extremity/Trunk Assessment RUE Assessment RUE Assessment: Within Functional Limits LUE Assessment LUE Assessment: Within Functional Limits   See Function Navigator for Current Functional Status.  Garrett Mitchum OTR/L 11/20/2015, 4:32 PM

## 2015-11-20 NOTE — Progress Notes (Signed)
Social Work  Discharge Note  The overall goal for the admission was met for:   Discharge location: [place] TO ASSIST-WILL NOT HAVE 24 HR CARE  Length of Stay: No-4 DAYS  Discharge activity level: No-SUPERVISION/MIN ASSIST LEVEL  Home/community participation: Yes  Services provided included: MD, RD, PT, OT, SLP, RN, CM, Pharmacy and SW  Financial Services: Other: SELF PAY  Follow-up services arranged: Other: COULD NOT FIND A HH AGENCY OR OP TO TAKE PT DUE TO UNINSURED  Comments (or additional information):PT WANTING TO LEAVE STAYED FOR A DAY FOR [name]-GIRLFRIEND TO LEARN HIS BS AND INSULIN CARE. UNSURE IF PT WILL FOLLOW ANY RECOMMENDATIONS AND CONTINUE TO BE NON-COMPLIANT WHICH WOULD RESULT IN ANOTHER HOSPITALIZATION.  GAVE MATCH PROGRAM TO ASSIST WITH MEDICATIONS AND FREE CLINIC INFORMATION FOR [place] [name] TO FOLLOW UP WITH THIS. BOTH AWARE OF CONSEQUENCES IF PT CONTINUES TO BE NON-COMPLIANT WITH HIS DIABETES. REALLY NEEDS 24 HR CARE DUE TO HIGH RISK OF FALLING DUE TO BALANCE ISSUES. BOTH AWARE AND STILL GOING HOME TODAY. MATCH FORM NOT TAKEN WITH THEM WILL FAX TO PHARMACY WHEN ARRIVE THERE.  Patient/Family verbalized understanding of follow-up arrangements: Yes  Individual responsible for coordination of the follow-up plan: [name]-GIRLFRIEND  Confirmed correct DME delivered: Elease Hashimoto 11/20/2015    Elease Hashimoto

## 2015-11-20 NOTE — Progress Notes (Signed)
Pt discharged to home with girlfriend. Discharge instructions given by Harvel Ricksan Anguilli. RN attempted once again education to patient regarding diet, medications, and blood sugar management with pt refusing any education. Pt states, "I just want to go." Belonging with pt.

## 2015-11-20 NOTE — Plan of Care (Signed)
Problem: RH Ambulation Goal: LTG Patient will ambulate in controlled environment (PT) LTG: Patient will ambulate in a controlled environment, # of feet with assistance (PT).  Outcome: Not Met (add Reason) Patient requires physical assistance to prevent fall with frequent LOB due to staggering/ataxic gait, decreased balance, decreased awareness of deficits, and decreased safety awareness.  Goal: LTG Patient will ambulate in home environment (PT) LTG: Patient will ambulate in home environment, # of feet with assistance (PT).  Outcome: Not Met (add Reason) Patient requires physical assistance to prevent fall with frequent LOB due to staggering/ataxic gait, decreased balance, decreased awareness of deficits, and decreased safety awareness.  Goal: LTG Patient will ambulate in community environment (PT) LTG: Patient will ambulate in community environment, # of feet with assistance (PT).  Outcome: Not Met (add Reason) Patient requires physical assistance to prevent fall with frequent LOB due to staggering/ataxic gait, decreased balance, decreased awareness of deficits, and decreased safety awareness.   Problem: RH Stairs Goal: LTG Patient will ambulate up and down stairs w/assist (PT) LTG: Patient will ambulate up and down # of stairs with assistance (PT)  Outcome: Not Met (add Reason) Patient dependent on upper extremity support on rails for stair negotiation.

## 2015-11-20 NOTE — Progress Notes (Signed)
Subjective/Complaints: Pt states he smoked PTA but intends to quit Per RN very hungry, always snacking ROS- neg N/V/D, no CP or SOB Objective: Vital Signs: Blood pressure 193/86, pulse 79, temperature 98.2 F (36.8 C), temperature source Oral, resp. rate 17, weight 82.101 kg (181 lb), SpO2 100 %. No results found. Results for orders placed or performed during the hospital encounter of 11/16/15 (from the past 72 hour(s))  Glucose, capillary     Status: Abnormal   Collection Time: 11/17/15  8:37 AM  Result Value Ref Range   Glucose-Capillary 279 (H) 65 - 99 mg/dL   Comment 1 Notify RN   Glucose, capillary     Status: Abnormal   Collection Time: 11/17/15 12:14 PM  Result Value Ref Range   Glucose-Capillary 248 (H) 65 - 99 mg/dL  Glucose, capillary     Status: Abnormal   Collection Time: 11/17/15  4:06 PM  Result Value Ref Range   Glucose-Capillary 370 (H) 65 - 99 mg/dL   Comment 1 Notify RN   Glucose, capillary     Status: Abnormal   Collection Time: 11/17/15  7:59 PM  Result Value Ref Range   Glucose-Capillary 346 (H) 65 - 99 mg/dL  Glucose, capillary     Status: Abnormal   Collection Time: 11/18/15 12:00 AM  Result Value Ref Range   Glucose-Capillary 207 (H) 65 - 99 mg/dL  Glucose, capillary     Status: Abnormal   Collection Time: 11/18/15  4:05 AM  Result Value Ref Range   Glucose-Capillary 185 (H) 65 - 99 mg/dL  Glucose, capillary     Status: Abnormal   Collection Time: 11/18/15  7:23 AM  Result Value Ref Range   Glucose-Capillary 204 (H) 65 - 99 mg/dL  Glucose, capillary     Status: Abnormal   Collection Time: 11/18/15 11:35 AM  Result Value Ref Range   Glucose-Capillary 242 (H) 65 - 99 mg/dL   Comment 1 Notify RN   Glucose, capillary     Status: Abnormal   Collection Time: 11/18/15  4:05 PM  Result Value Ref Range   Glucose-Capillary 348 (H) 65 - 99 mg/dL  Glucose, capillary     Status: Abnormal   Collection Time: 11/18/15  8:04 PM  Result Value Ref Range    Glucose-Capillary 370 (H) 65 - 99 mg/dL  Glucose, capillary     Status: Abnormal   Collection Time: 11/18/15 11:54 PM  Result Value Ref Range   Glucose-Capillary 263 (H) 65 - 99 mg/dL  Glucose, capillary     Status: Abnormal   Collection Time: 11/19/15  3:59 AM  Result Value Ref Range   Glucose-Capillary 174 (H) 65 - 99 mg/dL  CBC WITH DIFFERENTIAL     Status: Abnormal   Collection Time: 11/19/15  7:01 AM  Result Value Ref Range   WBC 12.9 (H) 4.0 - 10.5 K/uL   RBC 5.85 (H) 4.22 - 5.81 MIL/uL   Hemoglobin 18.0 (H) 13.0 - 17.0 g/dL   HCT 50.4 39.0 - 52.0 %   MCV 86.2 78.0 - 100.0 fL   MCH 30.8 26.0 - 34.0 pg   MCHC 35.7 30.0 - 36.0 g/dL   RDW 12.4 11.5 - 15.5 %   Platelets 288 150 - 400 K/uL   Neutrophils Relative % 60 %   Neutro Abs 7.9 (H) 1.7 - 7.7 K/uL   Lymphocytes Relative 31 %   Lymphs Abs 4.0 0.7 - 4.0 K/uL   Monocytes Relative 7 %   Monocytes Absolute  0.9 0.1 - 1.0 K/uL   Eosinophils Relative 1 %   Eosinophils Absolute 0.2 0.0 - 0.7 K/uL   Basophils Relative 1 %   Basophils Absolute 0.1 0.0 - 0.1 K/uL  Comprehensive metabolic panel     Status: Abnormal   Collection Time: 11/19/15  7:01 AM  Result Value Ref Range   Sodium 136 135 - 145 mmol/L   Potassium 4.2 3.5 - 5.1 mmol/L   Chloride 97 (L) 101 - 111 mmol/L   CO2 25 22 - 32 mmol/L   Glucose, Bld 169 (H) 65 - 99 mg/dL   BUN 17 6 - 20 mg/dL   Creatinine, Ser 0.69 0.61 - 1.24 mg/dL   Calcium 9.7 8.9 - 10.3 mg/dL   Total Protein 7.2 6.5 - 8.1 g/dL   Albumin 3.6 3.5 - 5.0 g/dL   AST 26 15 - 41 U/L   ALT 39 17 - 63 U/L   Alkaline Phosphatase 101 38 - 126 U/L   Total Bilirubin 1.3 (H) 0.3 - 1.2 mg/dL   GFR calc non Af Amer >60 >60 mL/min   GFR calc Af Amer >60 >60 mL/min    Comment: (NOTE) The eGFR has been calculated using the CKD EPI equation. This calculation has not been validated in all clinical situations. eGFR's persistently <60 mL/min signify possible Chronic Kidney Disease.    Anion gap 14 5 - 15   Glucose, capillary     Status: Abnormal   Collection Time: 11/19/15  8:15 AM  Result Value Ref Range   Glucose-Capillary 333 (H) 65 - 99 mg/dL  Glucose, capillary     Status: Abnormal   Collection Time: 11/19/15 11:55 AM  Result Value Ref Range   Glucose-Capillary 238 (H) 65 - 99 mg/dL  Glucose, capillary     Status: Abnormal   Collection Time: 11/19/15  4:48 PM  Result Value Ref Range   Glucose-Capillary 259 (H) 65 - 99 mg/dL  Glucose, capillary     Status: Abnormal   Collection Time: 11/19/15  8:37 PM  Result Value Ref Range   Glucose-Capillary 309 (H) 65 - 99 mg/dL  Glucose, capillary     Status: Abnormal   Collection Time: 11/20/15  6:48 AM  Result Value Ref Range   Glucose-Capillary 274 (H) 65 - 99 mg/dL    Recheck manual BP- 160/98 Rt arm HEENT: normal Cardio: RRR and no murmur Resp: CTA B/L and unlabored GI: BS positive and non tender and non distended Extremity:  Pulses positive and No Edema Skin:   Intact Neuro: Alert/Oriented, Cranial Nerve II-XII normal, Normal Motor and Other wide based stance Musc/Skel:  Normal Gen NAD Amb with walker SBA  Assessment/Plan: 1. 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  Stable for D/C today F/u PCP in 3-4 weeks F/u PM&R 2 weeks transitional care visit See D/C summary See D/C instructions FIM: Function - Bathing Position: Standing at sink Body parts bathed by patient: Right arm, Left arm, Chest, Abdomen, Front perineal area, Right upper leg, Left upper leg Body parts bathed by helper: Left lower leg, Right lower leg, Back, Buttocks Assist Level: Touching or steadying assistance(Pt > 75%)  Function- Upper Body Dressing/Undressing What is the patient wearing?: Pull over shirt/dress Pull over shirt/dress - Perfomed by patient: Thread/unthread right sleeve, Thread/unthread left sleeve, Put head through opening, Pull shirt over trunk Assist Level: Touching or steadying assistance(Pt > 75%) Function - Lower Body  Dressing/Undressing What is the patient wearing?: Underwear, Pants, Non-skid slipper socks Position: Sitting EOB  Underwear - Performed by patient: Thread/unthread right underwear leg, Thread/unthread left underwear leg, Pull underwear up/down Pants- Performed by patient: Pull pants up/down, Thread/unthread left pants leg, Thread/unthread right pants leg Non-skid slipper socks- Performed by patient: Don/doff right sock, Don/doff left sock Assist for footwear: Supervision/touching assist Assist for lower body dressing: Touching or steadying assistance (Pt > 75%)  Function - Toileting Toileting steps completed by patient: Adjust clothing prior to toileting, Performs perineal hygiene, Adjust clothing after toileting Toileting steps completed by helper: Adjust clothing prior to toileting, Adjust clothing after toileting (per [place], NT report) Toileting Assistive Devices: Grab bar or rail Assist level: Touching or steadying assistance (Pt.75%)  Function - Air cabin crew transfer assistive device: Walker Assist level to toilet: Touching or steadying assistance (Pt > 75%) Assist level from toilet: Touching or steadying assistance (Pt > 75%)  Function - Chair/bed transfer Chair/bed transfer method: Stand pivot Chair/bed transfer assist level: Total assist (Pt < 25%) Chair/bed transfer assistive device: Walker Chair/bed transfer details: Verbal cues for sequencing, Verbal cues for technique, Verbal cues for precautions/safety, Visual cues/gestures for precautions/safety, Verbal cues for safe use of DME/AE, Manual facilitation for weight shifting  Function - Locomotion: Wheelchair Will patient use wheelchair at discharge?: No Wheelchair activity did not occur: N/A Wheel 50 feet with 2 turns activity did not occur: N/A Wheel 150 feet activity did not occur: N/A Function - Locomotion: Ambulation Assistive device: Walker-rolling Max distance: 150 Assist level: Maximal assist (Pt 25 -  49%) Assist level: Moderate assist (Pt 50 - 74%) Walk 50 feet with 2 turns activity did not occur: Safety/medical concerns Assist level: Moderate assist (Pt 50 - 74%) Walk 150 feet activity did not occur: Safety/medical concerns Assist level: Maximal assist (Pt 25 - 49%) Assist level: Touching or steadying assistance (Pt > 75%)  Function - Comprehension Comprehension: Auditory Comprehension assist level: Follows basic conversation/direction with no assist  Function - Expression Expression: Verbal Expression assist level: Expresses complex ideas: With no assist  Function - Social Interaction Social Interaction assist level: Interacts appropriately 75 - 89% of the time - Needs redirection for appropriate language or to initiate interaction.  Function - Problem Solving Problem solving assist level: Solves basic problems with no assist  Function - Memory Memory assist level: Complete Independence: No helper Patient normally able to recall (first 3 days only): Current season, Staff names and faces, That he or she is in a hospital  1. 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 DVT Prophylaxis/Anticoagulation: SCDs. Monitor for any signs of DVT -mobilize 3. Pain Management: Tylenol as needed 4. Hypertension. Lisinopril 10 mg daily, Lopressor 25 mg twice a day. Permissive hypertension early on. 5. Neuropsych: This patient is capable of making decisions on his own behalf.Wants to go home despite education regarding CVA risk factors and recommendation of 2-3 more days of hospitalization to optimize BP and CBG 6. Skin/Wound Care: Routine skin checks 7. Fluids/Electrolytes/Nutrition: Routine I&O with follow-up chemistries -encourage adequate PO 8. Uncontrolled diabetes mellitus. Hemoglobin A1c 12.6. Check blood sugars before meals and at bedtime.  Diabetic teaching.Lantus 15 unit QHS, add metformin, teach injection 9. Tobacco abuse. Counseling 10. Constipation. Linzess - better 11.  + opiate in UDS- pt states it was from an ER, + amphetamine- pt couldn't account from  this no Rx for ritalin or similar med LOS (Days) 4 A FACE TO FACE EVALUATION WAS PERFORMED  KIRSTEINS,ANDREW E 11/20/2015, 7:27 AM

## 2015-11-20 NOTE — Progress Notes (Signed)
Awake requesting food every 2 hours. Not receptive to learn about insulin, CBG's or blood pressure. Derrick MartinezMurray, Osvaldo Lamping A

## 2015-11-21 NOTE — Progress Notes (Signed)
I agree with the above plan 

## 2015-11-22 ENCOUNTER — Telehealth: Payer: Self-pay

## 2015-11-22 NOTE — Telephone Encounter (Addendum)
1. Are you/is patient experiencing any problems since coming home? Are there any questions regarding any aspect of care? 2. Are there any questions regarding medications administration/dosing? Are meds being taken as prescribed? Patient should review meds with caller to confirm 3. Have there been any falls? 4. Has Home Health been to the house and/or have they contacted you? If not, have you tried to contact them? Can we help you contact them? 5. Are bowels and bladder emptying properly? Are there any unexpected incontinence issues? If applicable, is patient following bowel/bladder programs? 6. Any fevers, problems with breathing, unexpected pain? 7. Are there any skin problems or new areas of breakdown? 8. Has the patient/family member arranged specialty MD follow up (ie cardiology/neurology/renal/surgical/etc)?  Can we help arrange? 9. Does the patient need any other services or support that we can help arrange? 10. Are caregivers following through as expected in assisting the patient? 11. Has the patient quit smoking, drinking alcohol, or using drugs as recommended?   Pt appt on 12/04/15 with AK.

## 2015-11-23 NOTE — Telephone Encounter (Signed)
Pt packet mailed.  

## 2015-12-04 ENCOUNTER — Encounter: Payer: Self-pay | Admitting: Physical Medicine & Rehabilitation

## 2015-12-04 ENCOUNTER — Encounter: Payer: Self-pay | Attending: Physical Medicine & Rehabilitation

## 2016-10-19 IMAGING — DX DG CHEST 2V
2 series · 2 of 2 positions shown · non-contrast
Comparison: 11/13/2015 chest radiograph.

CLINICAL DATA: Leukocytosis.  Syncope.

EXAM:
CHEST  2 VIEW

[chest lat]
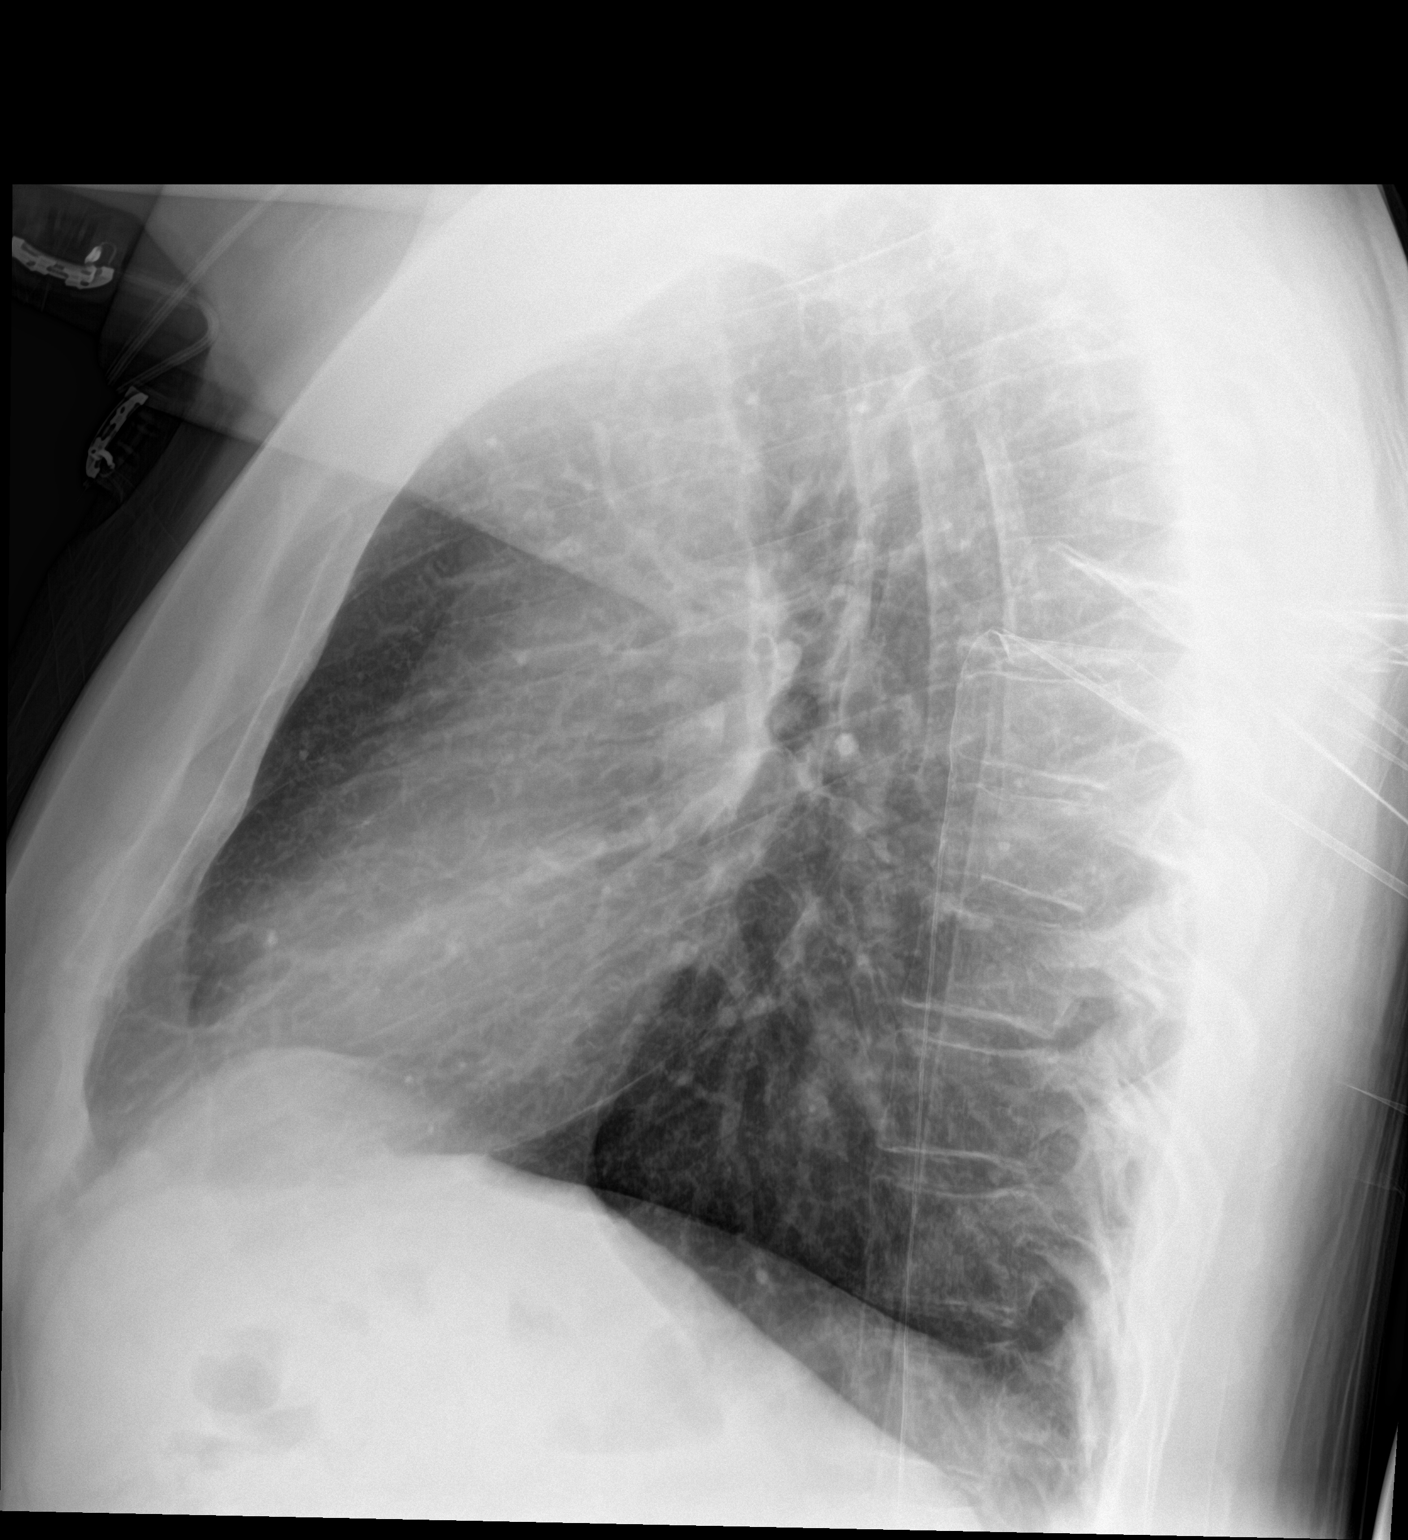

[chest ap]
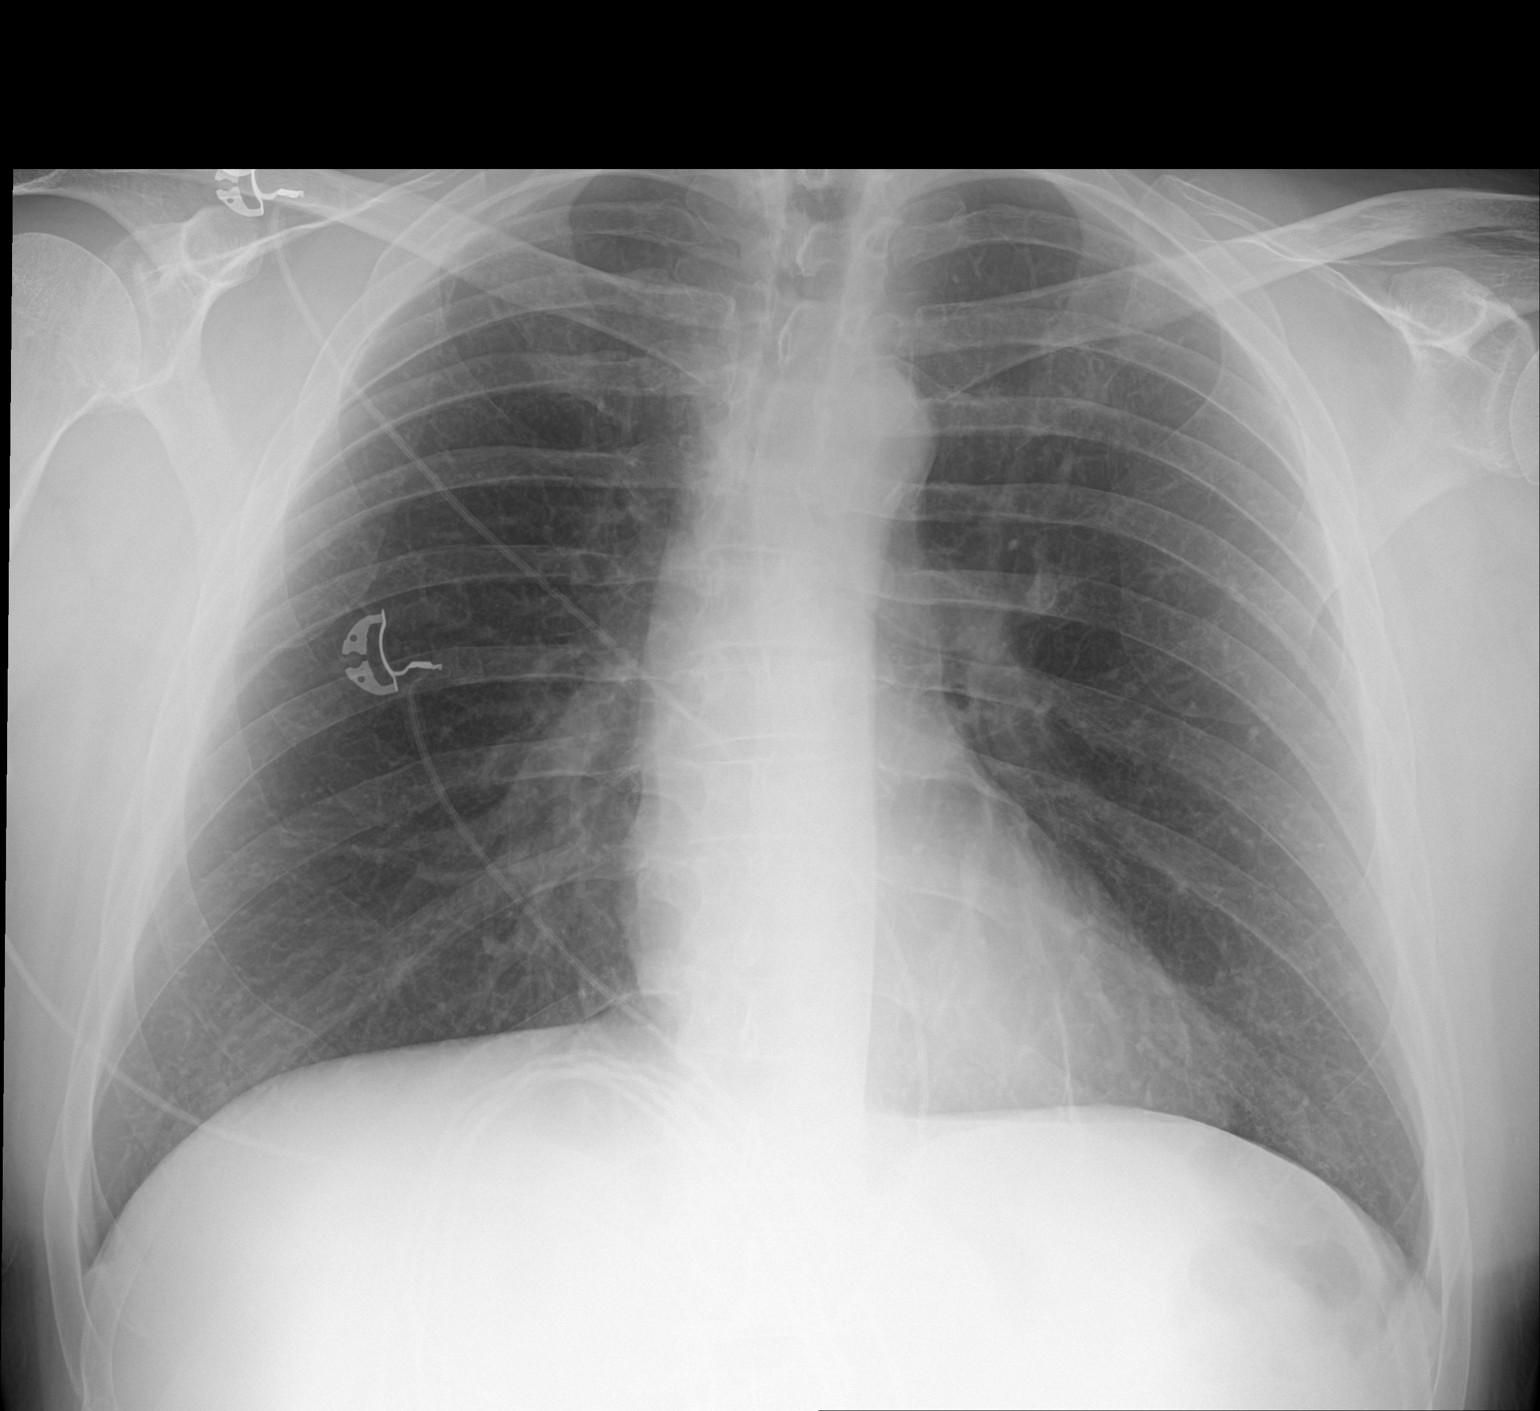

[2 of 2 positions shown; findings below may reference images not displayed]

FINDINGS: Stable cardiomediastinal silhouette with normal heart size. No
pneumothorax. No pleural effusion. Lungs appear clear, with no acute
consolidative airspace disease and no pulmonary edema.
IMPRESSION: No active cardiopulmonary disease.

## 2024-06-27 DEATH — deceased
